# Patient Record
Sex: Female | Born: 1955 | Race: White | Hispanic: No | Smoking: Never smoker
Health system: Southern US, Community
[De-identification: ages and names within clinical notes are randomized; demographics above are authoritative.]

## PROBLEM LIST (undated history)

## (undated) DIAGNOSIS — F319 Bipolar disorder, unspecified: Secondary | ICD-10-CM

## (undated) DIAGNOSIS — J45909 Unspecified asthma, uncomplicated: Secondary | ICD-10-CM

## (undated) DIAGNOSIS — F32A Depression, unspecified: Secondary | ICD-10-CM

## (undated) DIAGNOSIS — G4730 Sleep apnea, unspecified: Secondary | ICD-10-CM

## (undated) DIAGNOSIS — I341 Nonrheumatic mitral (valve) prolapse: Secondary | ICD-10-CM

## (undated) DIAGNOSIS — D649 Anemia, unspecified: Secondary | ICD-10-CM

## (undated) DIAGNOSIS — I10 Essential (primary) hypertension: Secondary | ICD-10-CM

## (undated) DIAGNOSIS — Q631 Lobulated, fused and horseshoe kidney: Secondary | ICD-10-CM

## (undated) DIAGNOSIS — I499 Cardiac arrhythmia, unspecified: Secondary | ICD-10-CM

## (undated) DIAGNOSIS — R519 Headache, unspecified: Secondary | ICD-10-CM

## (undated) DIAGNOSIS — K219 Gastro-esophageal reflux disease without esophagitis: Secondary | ICD-10-CM

## (undated) DIAGNOSIS — M1990 Unspecified osteoarthritis, unspecified site: Secondary | ICD-10-CM

## (undated) DIAGNOSIS — F329 Major depressive disorder, single episode, unspecified: Secondary | ICD-10-CM

## (undated) DIAGNOSIS — F419 Anxiety disorder, unspecified: Secondary | ICD-10-CM

## (undated) HISTORY — PX: ABDOMINAL HYSTERECTOMY: SHX81

## (undated) HISTORY — PX: CARDIAC CATHETERIZATION: SHX172

## (undated) HISTORY — PX: SHOULDER ARTHROSCOPY: SHX128

## (undated) HISTORY — PX: TUBAL LIGATION: SHX77

## (undated) HISTORY — PX: BLADDER SURGERY: SHX569

---
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
## 2019-02-04 ENCOUNTER — Other Ambulatory Visit: Payer: Self-pay | Admitting: Neurological Surgery

## 2019-02-26 ENCOUNTER — Ambulatory Visit (HOSPITAL_COMMUNITY)
Admission: RE | Admit: 2019-02-26 | Discharge: 2019-02-26 | Disposition: A | Discharge status: Home / Self Care | Payer: Medicare Other | Source: Ambulatory Visit | Attending: Neurological Surgery | Admitting: Neurological Surgery

## 2019-02-26 ENCOUNTER — Encounter (HOSPITAL_COMMUNITY): Payer: Self-pay

## 2019-02-26 ENCOUNTER — Encounter (HOSPITAL_COMMUNITY)
Admission: RE | Admit: 2019-02-26 | Discharge: 2019-02-26 | Disposition: A | Discharge status: Home / Self Care | Payer: Medicare Other | Source: Ambulatory Visit | Attending: Neurological Surgery | Admitting: Neurological Surgery

## 2019-02-26 ENCOUNTER — Other Ambulatory Visit: Payer: Self-pay

## 2019-02-26 DIAGNOSIS — F319 Bipolar disorder, unspecified: Secondary | ICD-10-CM | POA: Diagnosis not present

## 2019-02-26 DIAGNOSIS — Z01818 Encounter for other preprocedural examination: Secondary | ICD-10-CM | POA: Insufficient documentation

## 2019-02-26 DIAGNOSIS — M542 Cervicalgia: Secondary | ICD-10-CM | POA: Diagnosis not present

## 2019-02-26 DIAGNOSIS — D649 Anemia, unspecified: Secondary | ICD-10-CM | POA: Insufficient documentation

## 2019-02-26 DIAGNOSIS — I10 Essential (primary) hypertension: Secondary | ICD-10-CM | POA: Diagnosis not present

## 2019-02-26 DIAGNOSIS — J45909 Unspecified asthma, uncomplicated: Secondary | ICD-10-CM | POA: Diagnosis not present

## 2019-02-26 DIAGNOSIS — G4733 Obstructive sleep apnea (adult) (pediatric): Secondary | ICD-10-CM | POA: Diagnosis not present

## 2019-02-26 DIAGNOSIS — I341 Nonrheumatic mitral (valve) prolapse: Secondary | ICD-10-CM | POA: Diagnosis not present

## 2019-02-26 DIAGNOSIS — Z79899 Other long term (current) drug therapy: Secondary | ICD-10-CM | POA: Insufficient documentation

## 2019-02-26 DIAGNOSIS — K219 Gastro-esophageal reflux disease without esophagitis: Secondary | ICD-10-CM | POA: Diagnosis not present

## 2019-02-26 HISTORY — DX: Cardiac arrhythmia, unspecified: I49.9

## 2019-02-26 HISTORY — DX: Headache, unspecified: R51.9

## 2019-02-26 HISTORY — DX: Unspecified asthma, uncomplicated: J45.909

## 2019-02-26 HISTORY — DX: Gastro-esophageal reflux disease without esophagitis: K21.9

## 2019-02-26 HISTORY — DX: Depression, unspecified: F32.A

## 2019-02-26 HISTORY — DX: Anxiety disorder, unspecified: F41.9

## 2019-02-26 HISTORY — DX: Anemia, unspecified: D64.9

## 2019-02-26 HISTORY — DX: Bipolar disorder, unspecified: F31.9

## 2019-02-26 HISTORY — DX: Lobulated, fused and horseshoe kidney: Q63.1

## 2019-02-26 HISTORY — DX: Essential (primary) hypertension: I10

## 2019-02-26 HISTORY — DX: Sleep apnea, unspecified: G47.30

## 2019-02-26 HISTORY — DX: Unspecified osteoarthritis, unspecified site: M19.90

## 2019-02-26 HISTORY — DX: Nonrheumatic mitral (valve) prolapse: I34.1

## 2019-02-26 LAB — BASIC METABOLIC PANEL
Anion gap: 11 (ref 5–15)
BUN: 18 mg/dL (ref 8–23)
CO2: 22 mmol/L (ref 22–32)
Calcium: 9.5 mg/dL (ref 8.9–10.3)
Chloride: 105 mmol/L (ref 98–111)
Creatinine, Ser: 1.06 mg/dL — ABNORMAL HIGH (ref 0.44–1.00)
GFR calc Af Amer: >60 mL/min (ref >60)
GFR calc non Af Amer: 55 mL/min — ABNORMAL LOW (ref >60)
Glucose, Bld: 176 mg/dL — ABNORMAL HIGH (ref 70–99)
Potassium: 3.9 mmol/L (ref 3.5–5.1)
Sodium: 138 mmol/L (ref 135–145)

## 2019-02-26 LAB — CBC WITH DIFFERENTIAL/PLATELET
Abs Immature Granulocytes: 0.07 10*3/uL (ref 0.00–0.07)
Basophils Absolute: 0.1 10*3/uL (ref 0.0–0.1)
Basophils Relative: 1 %
Eosinophils Absolute: 0.2 10*3/uL (ref 0.0–0.5)
Eosinophils Relative: 2 %
HCT: 41.4 % (ref 36.0–46.0)
Hemoglobin: 13.1 g/dL (ref 12.0–15.0)
Immature Granulocytes: 1 %
Lymphocytes Relative: 20 %
Lymphs Abs: 2.4 10*3/uL (ref 0.7–4.0)
MCH: 28.4 pg (ref 26.0–34.0)
MCHC: 31.6 g/dL (ref 30.0–36.0)
MCV: 89.8 fL (ref 80.0–100.0)
Monocytes Absolute: 1 10*3/uL (ref 0.1–1.0)
Monocytes Relative: 8 %
Neutro Abs: 8 10*3/uL — ABNORMAL HIGH (ref 1.7–7.7)
Neutrophils Relative %: 68 %
Platelets: 234 10*3/uL (ref 150–400)
RBC: 4.61 MIL/uL (ref 3.87–5.11)
RDW: 13.5 % (ref 11.5–15.5)
WBC: 11.7 10*3/uL — ABNORMAL HIGH (ref 4.0–10.5)
nRBC: 0 % (ref 0.0–0.2)

## 2019-02-26 LAB — PROTIME-INR
INR: 1 (ref 0.8–1.2)
Prothrombin Time: 12.8 seconds (ref 11.4–15.2)

## 2019-02-26 LAB — TYPE AND SCREEN
ABO/RH(D): A POS
Antibody Screen: NEGATIVE

## 2019-02-26 LAB — ABO/RH: ABO/RH(D): A POS

## 2019-02-26 LAB — SURGICAL PCR SCREEN
MRSA, PCR: NEGATIVE
Staphylococcus aureus: NEGATIVE

## 2019-02-26 NOTE — Progress Notes (Signed)
PCP - Dicky Doe, NP Cardiologist - patient denies  Chest x-ray - 02/26/2019 EKG - 02/26/2019 Stress Test - patient states "it was years ago" - records requested  ECHO - 2017 (Care everywhere) - records requested Cardiac Cath - patient states "it was years ago" - records requested  Sleep Study - patient is not sure when or where so I could not request records CPAP - yes  Fasting Blood Sugar - n/a Checks Blood Sugar _____ times a day  Blood Thinner Instructions:n/a Aspirin Instructions: n/a  Anesthesia review: yes, patient states she has MVP  Patient denies shortness of breath, fever, cough and chest pain at PAT appointment   Coronavirus Screening  Have you experienced the following symptoms:  Cough yes/no: No Fever (>100.43F)  yes/no: No Runny nose yes/no: No Sore throat yes/no: No Difficulty breathing/shortness of breath  yes/no: No  Have you or a family member traveled in the last 14 days and where? yes/no: No   If the patient indicates "YES" to the above questions, their PAT will be rescheduled to limit the exposure to others and, the surgeon will be notified. THE PATIENT WILL NEED TO BE ASYMPTOMATIC FOR 14 DAYS.   If the patient is not experiencing any of these symptoms, the PAT nurse will instruct them to NOT bring anyone with them to their appointment since they may have these symptoms or traveled as well.   Please remind your patients and families that hospital visitation restrictions are in effect and the importance of the restrictions.    Patient verbalized understanding of instructions that were given to them at the PAT appointment. Patient was also instructed that they will need to review over the PAT instructions again at home before surgery.

## 2019-02-26 NOTE — Progress Notes (Signed)
[facility] [address] - [place], [place] - [address] [place] [address] Phone: [phone] Fax: [phone]      Your procedure is scheduled on Monday 29, 2020.  Report to [facility] Main Entrance "A" at 0530 A.M., and check in at the Admitting office.  Call this number if you have problems the morning of surgery:  [phone]  Call [phone] if you have any questions prior to your surgery date Monday-Friday 8am-4pm    Remember:  Do not eat or drink after midnight the night before your surgery.    Take these medicines the morning of surgery with A SIP OF WATER: Acetaminophen (Tylenol) Citalopram (Celexa) Esomeprazole (Nexium) Fluticasone (Flonase) Hydroxyzine (Atarax) Lamotrigine (Lamictal)   7 days prior to surgery STOP taking any Aspirin (unless otherwise instructed by your surgeon), Aleve, Naproxen, Ibuprofen, Motrin, Advil, Goody's, BC's, all herbal medications, fish oil, and all vitamins.    The Morning of Surgery  Do not wear jewelry, make-up or nail polish.  Do not wear lotions, powders, or perfumes/colognes, or deodorant  Do not shave 48 hours prior to surgery.  Men may shave face and neck.  Do not bring valuables to the hospital.  [facility] is not responsible for any belongings or valuables.  If you are a smoker, DO NOT Smoke 24 hours prior to surgery IF you wear a CPAP at night please bring your mask, tubing, and machine the morning of surgery   Remember that you must have someone to transport you home after your surgery, and remain with you for 24 hours if you are discharged the same day.   Contacts, glasses, hearing aids, dentures or bridgework may not be worn into surgery.    Leave your suitcase in the car.  After surgery it may be brought to your room.  For patients admitted to the hospital, discharge time will be determined by your treatment team.  Patients discharged the day of surgery will not be allowed to drive home.     Special instructions:   [place]- Preparing For Surgery  Before surgery, you can play an important role. Because skin is not sterile, your skin needs to be as free of germs as possible. You can reduce the number of germs on your skin by washing with CHG (chlorahexidine gluconate) Soap before surgery.  CHG is an antiseptic cleaner which kills germs and bonds with the skin to continue killing germs even after washing.    Oral Hygiene is also important to reduce your risk of infection.  Remember - BRUSH YOUR TEETH THE MORNING OF SURGERY WITH YOUR REGULAR TOOTHPASTE  Please do not use if you have an allergy to CHG or antibacterial soaps. If your skin becomes reddened/irritated stop using the CHG.  Do not shave (including legs and underarms) for at least 48 hours prior to first CHG shower. It is OK to shave your face.  Please follow these instructions carefully.   1. Shower the NIGHT BEFORE SURGERY and the MORNING OF SURGERY with CHG Soap.   2. If you chose to wash your hair, wash your hair first as usual with your normal shampoo.  3. After you shampoo, rinse your hair and body thoroughly to remove the shampoo.  4. Use CHG as you would any other liquid soap. You can apply CHG directly to the skin and wash gently with a scrungie or a clean washcloth.   5. Apply the CHG Soap to your body ONLY FROM THE NECK  DOWN.  Do not use on open wounds or open sores. Avoid contact with your eyes, ears, mouth and genitals (private parts). Wash Face and genitals (private parts)  with your normal soap.   6. Wash thoroughly, paying special attention to the area where your surgery will be performed.  7. Thoroughly rinse your body with warm water from the neck down.  8. DO NOT shower/wash with your normal soap after using and rinsing off the CHG Soap.  9. Pat yourself dry with a CLEAN TOWEL.  10. Wear CLEAN PAJAMAS to bed the night before surgery, wear comfortable clothes the morning of  surgery  11. Place CLEAN SHEETS on your bed the night of your first shower and DO NOT SLEEP WITH PETS.    Day of Surgery: Shower as stated above. Do not apply any deodorants/lotions.  Please wear clean clothes to the hospital/surgery center.   Remember to brush your teeth WITH YOUR REGULAR TOOTHPASTE.   Please read over the following fact sheets that you were given.

## 2019-03-03 ENCOUNTER — Encounter (HOSPITAL_COMMUNITY): Payer: Self-pay

## 2019-03-03 NOTE — Progress Notes (Addendum)
Anesthesia Chart Review:  Case: [id number] Date/Time: 03/08/19 0715   Procedure: ACDF - C5-C6 - C6-C7 (N/A )   Anesthesia type: General   Pre-op diagnosis: Cervicalgia   Location: [facility] OR ROOM 18 / [place] OR   Surgeon: Eustace Moore, MD      DISCUSSION: Patient is a 63 year old female scheduled for the above procedure.  History includes never smoker, HTN, OSA (CPAP), MVP (grossly normal MV, mild MR/TR 2017), dysrhythmia (not specified), asthma, GERD, anemia, Bipolar disorder. BMI is consistent with morbid obesity.   Presurgical COVID test is scheduled for 03/04/19.  If result is negative and otherwise no acute changes and I would anticipate that she can proceed as planned.   VS: BP 128/68   Pulse 88   Temp 36.7 C   Resp 20   Ht 5\' 2"  (1.575 m)   Wt 108 kg   SpO2 94%   BMI 43.53 kg/m    PROVIDERS: Noralyn Pick, NP is PCP ([place], see Care Everywhere) [phone]  [phone] (Fax) - Laurena Slimmer, MD is nephrologist ([place]). 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 Wendi Snipes, MD is psychiatrist - She was evaluated by cardiologist Dennison Nancy, DO on 08/29/16 for evaluation of edema. Edema felt likely from venous insufficiency, but echo ordered to rule out structural heart disease. Unless significant abnormalities then as needed follow-up was anticipated. ([place], see Care Everywhere). [phone]  [phone] (Fax)    LABS: Preoperative labs noted. Non-fasting glucose 176 (no reported DM history; last A1c seen was 6.4 on 04/22/18, [facility]).  (all labs ordered are listed, but only abnormal results are displayed)  Labs Reviewed  BASIC METABOLIC PANEL - Abnormal; Notable for the following components:      Result Value   Glucose, Bld 176 (*)    Creatinine, Ser 1.06 (*)    GFR calc non Af Amer 55 (*)    All other components within normal limits  CBC WITH  DIFFERENTIAL/PLATELET - Abnormal; Notable for the following components:   WBC 11.7 (*)    Neutro Abs 8.0 (*)    All other components within normal limits  SURGICAL PCR SCREEN  PROTIME-INR  TYPE AND SCREEN  ABO/RH    IMAGES: CXR 02/26/19: FINDINGS: The heart size and mediastinal contours are within normal limits. Both lungs are clear. Slight accentuation of the thoracic kyphosis. IMPRESSION: No significant abnormalities.   EKG: 02/26/19: Normal sinus rhythm RSR' or QR pattern in V1 suggests right ventricular conduction delay Minimal voltage criteria for LVH, may be normal variant Nonspecific ST abnormality Abnormal ECG No previous tracing Confirmed by Quay Burow [phone]) on 03/01/2019 1:46:40 PM  - EKG not significantly changed when compared to 03/10/18 EKG tracing from [place] (which was done at PCP office for high risk psychiatric medication).    CV: Echo 09/05/16 [facility]iology): Summary: The study was technically difficult. The left ventricle is normal in size, wall thickness and wall motion with ejection fraction of 60-65%. The mitral valve is grossly normal.  There is mild (1+) mitral regurgitation. There is mild (1+) tricuspid regurgitation. Aortic valve is not well visualized, but is grossly normal. Right ventricular systolic function is normal. The left atrium is mildly dilated. Grade 2 moderate diastolic dysfunction; pseudo-normal mitral inflow pattern.  Patient thought she may have had a stress and/or cardiac cath "years ago" (possibly 10 years) but no records seen in [facility] or Care Everywhere and  not mentioned in 2017 cardiology notes.    Past Medical History:  Diagnosis Date  . Anemia   . Anxiety   . Arthritis   . Asthma   . Bipolar disorder (HCC)   . Depression   . Dysrhythmia   . GERD (gastroesophageal reflux disease)   . Headache   . Horseshoe kidney    Nephrologist: Dr. Angelique Blonderenise Laurienti [facility]  . Hypertension   . Mitral valve prolapse   .  Sleep apnea    wears cpap    Past Surgical History:  Procedure Laterality Date  . ABDOMINAL HYSTERECTOMY    . BLADDER SURGERY    . BLADDER SURGERY     mesh removal  . CARDIAC CATHETERIZATION     patient unsure when or where  . SHOULDER ARTHROSCOPY Right    x3  . TUBAL LIGATION      MEDICATIONS: . acetaminophen (TYLENOL) 500 MG tablet  . citalopram (CELEXA) 40 MG tablet  . esomeprazole (NEXIUM) 20 MG capsule  . fluticasone (FLONASE) 50 MCG/ACT nasal spray  . hydrOXYzine (ATARAX/VISTARIL) 25 MG tablet  . lamoTRIgine (LAMICTAL) 25 MG tablet  . losartan (COZAAR) 25 MG tablet  . traZODone (DESYREL) 100 MG tablet   No current facility-administered medications for this encounter.     Shonna ChockAllison Casey Maxfield, PA-C Surgical Short Stay/Anesthesiology [facility] Phone [phone] [facility] Phone [phone] 03/04/2019 11:12 AM

## 2019-03-04 ENCOUNTER — Encounter (HOSPITAL_COMMUNITY): Payer: Self-pay

## 2019-03-04 ENCOUNTER — Other Ambulatory Visit (HOSPITAL_COMMUNITY)
Admission: RE | Admit: 2019-03-04 | Discharge: 2019-03-04 | Disposition: A | Discharge status: Home / Self Care | Payer: Medicare Other | Source: Ambulatory Visit | Attending: Neurological Surgery | Admitting: Neurological Surgery

## 2019-03-04 DIAGNOSIS — Z1159 Encounter for screening for other viral diseases: Secondary | ICD-10-CM | POA: Diagnosis present

## 2019-03-04 LAB — SARS CORONAVIRUS 2 (TAT 6-24 HRS): SARS Coronavirus 2: NEGATIVE

## 2019-03-04 NOTE — Anesthesia Preprocedure Evaluation (Addendum)
Anesthesia Evaluation  Patient identified by MRN, date of birth, ID band Patient awake    Reviewed: NPO status , Patient's Chart, lab work & pertinent test results  Airway Mallampati: II  TM Distance: >3 FB     Dental   Pulmonary    breath sounds clear to auscultation       Cardiovascular hypertension, + dysrhythmias  Rhythm:Regular     Neuro/Psych    GI/Hepatic GERD  ,  Endo/Other    Renal/GU Renal disease     Musculoskeletal   Abdominal   Peds  Hematology  (+) anemia ,   Anesthesia Other Findings   Reproductive/Obstetrics                           Anesthesia Physical Anesthesia Plan  ASA: III  Anesthesia Plan: General   Post-op Pain Management:    Induction: Intravenous  PONV Risk Score and Plan: 3 and Ondansetron, Dexamethasone and Midazolam  Airway Management Planned: Oral ETT  Additional Equipment:   Intra-op Plan:   Post-operative Plan: Possible Post-op intubation/ventilation  Informed Consent: I have reviewed the patients History and Physical, chart, labs and discussed the procedure including the risks, benefits and alternatives for the proposed anesthesia with the patient or authorized representative who has indicated his/her understanding and acceptance.     Dental advisory given  Plan Discussed with: Anesthesiologist and CRNA  Anesthesia Plan Comments: (PAT note written 03/04/2019 by Myra Gianotti, PA-C. )      Anesthesia Quick Evaluation

## 2019-03-08 ENCOUNTER — Inpatient Hospital Stay (HOSPITAL_COMMUNITY): Payer: Medicare Other

## 2019-03-08 ENCOUNTER — Inpatient Hospital Stay (HOSPITAL_COMMUNITY): Payer: Medicare Other | Admitting: Anesthesiology

## 2019-03-08 ENCOUNTER — Encounter (HOSPITAL_COMMUNITY): Payer: Self-pay

## 2019-03-08 ENCOUNTER — Inpatient Hospital Stay (HOSPITAL_COMMUNITY): Payer: Medicare Other | Admitting: Physician Assistant

## 2019-03-08 ENCOUNTER — Encounter (HOSPITAL_COMMUNITY)
Admission: RE | Disposition: A | Discharge status: Home / Self Care | Payer: Self-pay | Source: Home / Self Care | Attending: Neurological Surgery

## 2019-03-08 ENCOUNTER — Other Ambulatory Visit: Payer: Self-pay

## 2019-03-08 ENCOUNTER — Observation Stay (HOSPITAL_COMMUNITY)
Admission: RE | Admit: 2019-03-08 | Discharge: 2019-03-09 | Disposition: A | Discharge status: Home / Self Care | Payer: Medicare Other | Attending: Neurological Surgery | Admitting: Neurological Surgery

## 2019-03-08 DIAGNOSIS — K219 Gastro-esophageal reflux disease without esophagitis: Secondary | ICD-10-CM | POA: Insufficient documentation

## 2019-03-08 DIAGNOSIS — Z419 Encounter for procedure for purposes other than remedying health state, unspecified: Secondary | ICD-10-CM

## 2019-03-08 DIAGNOSIS — Z881 Allergy status to other antibiotic agents status: Secondary | ICD-10-CM | POA: Diagnosis not present

## 2019-03-08 DIAGNOSIS — Z882 Allergy status to sulfonamides status: Secondary | ICD-10-CM | POA: Diagnosis not present

## 2019-03-08 DIAGNOSIS — M50322 Other cervical disc degeneration at C5-C6 level: Secondary | ICD-10-CM | POA: Diagnosis not present

## 2019-03-08 DIAGNOSIS — M47812 Spondylosis without myelopathy or radiculopathy, cervical region: Secondary | ICD-10-CM | POA: Diagnosis not present

## 2019-03-08 DIAGNOSIS — M4802 Spinal stenosis, cervical region: Secondary | ICD-10-CM | POA: Insufficient documentation

## 2019-03-08 DIAGNOSIS — Z885 Allergy status to narcotic agent status: Secondary | ICD-10-CM | POA: Diagnosis not present

## 2019-03-08 DIAGNOSIS — F419 Anxiety disorder, unspecified: Secondary | ICD-10-CM | POA: Diagnosis not present

## 2019-03-08 DIAGNOSIS — J45909 Unspecified asthma, uncomplicated: Secondary | ICD-10-CM | POA: Insufficient documentation

## 2019-03-08 DIAGNOSIS — F319 Bipolar disorder, unspecified: Secondary | ICD-10-CM | POA: Insufficient documentation

## 2019-03-08 DIAGNOSIS — Z79899 Other long term (current) drug therapy: Secondary | ICD-10-CM | POA: Diagnosis not present

## 2019-03-08 DIAGNOSIS — I10 Essential (primary) hypertension: Secondary | ICD-10-CM | POA: Diagnosis not present

## 2019-03-08 DIAGNOSIS — Z981 Arthrodesis status: Secondary | ICD-10-CM

## 2019-03-08 DIAGNOSIS — G4730 Sleep apnea, unspecified: Secondary | ICD-10-CM | POA: Diagnosis not present

## 2019-03-08 HISTORY — PX: ANTERIOR CERVICAL DECOMP/DISCECTOMY FUSION: SHX1161

## 2019-03-08 SURGERY — ANTERIOR CERVICAL DECOMPRESSION/DISCECTOMY FUSION 2 LEVELS
Anesthesia: General | Site: Spine Cervical

## 2019-03-08 MED ORDER — CEFAZOLIN SODIUM-DEXTROSE 2-4 GM/100ML-% IV SOLN
2.0000 g | INTRAVENOUS | Status: AC
  Administered 2019-03-08: 2 g via INTRAVENOUS
  Filled 2019-03-08: qty 100

## 2019-03-08 MED ORDER — SUGAMMADEX SODIUM 200 MG/2ML IV SOLN
INTRAVENOUS | Status: DC | PRN
  Administered 2019-03-08: 200 mg via INTRAVENOUS

## 2019-03-08 MED ORDER — CITALOPRAM HYDROBROMIDE 20 MG PO TABS
40.0000 mg | ORAL_TABLET | Freq: Every day | ORAL | Status: DC
  Administered 2019-03-09: 40 mg via ORAL
  Filled 2019-03-08: qty 2

## 2019-03-08 MED ORDER — 0.9 % SODIUM CHLORIDE (POUR BTL) OPTIME
TOPICAL | Status: DC | PRN
  Administered 2019-03-08: 1000 mL

## 2019-03-08 MED ORDER — SODIUM CHLORIDE 0.9% FLUSH: 3.0000 mL | INTRAVENOUS | Status: DC | PRN

## 2019-03-08 MED ORDER — PHENYLEPHRINE 40 MCG/ML (10ML) SYRINGE FOR IV PUSH (FOR BLOOD PRESSURE SUPPORT)
PREFILLED_SYRINGE | INTRAVENOUS | Status: AC
  Filled 2019-03-08: qty 10

## 2019-03-08 MED ORDER — CEFAZOLIN SODIUM-DEXTROSE 2-4 GM/100ML-% IV SOLN
2.0000 g | Freq: Three times a day (TID) | INTRAVENOUS | Status: AC
  Administered 2019-03-08 (×2): 2 g via INTRAVENOUS
  Filled 2019-03-08 (×2): qty 100

## 2019-03-08 MED ORDER — PROPOFOL 10 MG/ML IV BOLUS
INTRAVENOUS | Status: DC | PRN
  Administered 2019-03-08: 160 mg via INTRAVENOUS

## 2019-03-08 MED ORDER — FENTANYL CITRATE (PF) 250 MCG/5ML IJ SOLN
INTRAMUSCULAR | Status: AC
  Filled 2019-03-08: qty 5

## 2019-03-08 MED ORDER — DEXAMETHASONE SODIUM PHOSPHATE 10 MG/ML IJ SOLN
10.0000 mg | INTRAMUSCULAR | Status: DC
  Filled 2019-03-08: qty 1

## 2019-03-08 MED ORDER — DEXAMETHASONE 4 MG PO TABS
4.0000 mg | ORAL_TABLET | Freq: Four times a day (QID) | ORAL | Status: DC
  Administered 2019-03-08 – 2019-03-09 (×3): 4 mg via ORAL
  Filled 2019-03-08 (×3): qty 1

## 2019-03-08 MED ORDER — ROCURONIUM BROMIDE 10 MG/ML (PF) SYRINGE
PREFILLED_SYRINGE | INTRAVENOUS | Status: DC | PRN
  Administered 2019-03-08: 10 mg via INTRAVENOUS
  Administered 2019-03-08: 60 mg via INTRAVENOUS

## 2019-03-08 MED ORDER — MORPHINE SULFATE (PF) 2 MG/ML IV SOLN
2.0000 mg | INTRAVENOUS | Status: DC | PRN
  Administered 2019-03-08: 2 mg via INTRAVENOUS
  Filled 2019-03-08: qty 1

## 2019-03-08 MED ORDER — SODIUM CHLORIDE 0.9 % IV SOLN: 250.0000 mL | INTRAVENOUS | Status: DC

## 2019-03-08 MED ORDER — PHENOL 1.4 % MT LIQD: 1.0000 | OROMUCOSAL | Status: DC | PRN

## 2019-03-08 MED ORDER — LIDOCAINE 2% (20 MG/ML) 5 ML SYRINGE
INTRAMUSCULAR | Status: AC
  Filled 2019-03-08: qty 5

## 2019-03-08 MED ORDER — LOSARTAN POTASSIUM 25 MG PO TABS
25.0000 mg | ORAL_TABLET | Freq: Every day | ORAL | Status: DC
  Administered 2019-03-08 – 2019-03-09 (×2): 25 mg via ORAL
  Filled 2019-03-08 (×2): qty 1

## 2019-03-08 MED ORDER — BUPIVACAINE HCL (PF) 0.25 % IJ SOLN
INTRAMUSCULAR | Status: DC | PRN
  Administered 2019-03-08: 10 mL

## 2019-03-08 MED ORDER — DEXAMETHASONE SODIUM PHOSPHATE 10 MG/ML IJ SOLN
INTRAMUSCULAR | Status: DC | PRN
  Administered 2019-03-08: 10 mg via INTRAVENOUS

## 2019-03-08 MED ORDER — CHLORHEXIDINE GLUCONATE CLOTH 2 % EX PADS: 6.0000 | MEDICATED_PAD | Freq: Once | CUTANEOUS | Status: DC

## 2019-03-08 MED ORDER — SODIUM CHLORIDE 0.9 % IV SOLN
INTRAVENOUS | Status: DC | PRN
  Administered 2019-03-08: 25 ug/min via INTRAVENOUS

## 2019-03-08 MED ORDER — ACETAMINOPHEN 325 MG PO TABS: 650.0000 mg | ORAL_TABLET | ORAL | Status: DC | PRN

## 2019-03-08 MED ORDER — DEXAMETHASONE SODIUM PHOSPHATE 10 MG/ML IJ SOLN
INTRAMUSCULAR | Status: AC
  Filled 2019-03-08: qty 1

## 2019-03-08 MED ORDER — ACETAMINOPHEN 650 MG RE SUPP: 650.0000 mg | RECTAL | Status: DC | PRN

## 2019-03-08 MED ORDER — ONDANSETRON HCL 4 MG/2ML IJ SOLN
INTRAMUSCULAR | Status: AC
  Filled 2019-03-08: qty 2

## 2019-03-08 MED ORDER — PHENYLEPHRINE 40 MCG/ML (10ML) SYRINGE FOR IV PUSH (FOR BLOOD PRESSURE SUPPORT)
PREFILLED_SYRINGE | INTRAVENOUS | Status: DC | PRN
  Administered 2019-03-08: 80 ug via INTRAVENOUS

## 2019-03-08 MED ORDER — HYDROCODONE-ACETAMINOPHEN 5-325 MG PO TABS
ORAL_TABLET | ORAL | Status: AC
  Filled 2019-03-08: qty 1

## 2019-03-08 MED ORDER — THROMBIN 5000 UNITS EX SOLR
CUTANEOUS | Status: AC
  Filled 2019-03-08: qty 5000

## 2019-03-08 MED ORDER — ROCURONIUM BROMIDE 10 MG/ML (PF) SYRINGE
PREFILLED_SYRINGE | INTRAVENOUS | Status: AC
  Filled 2019-03-08: qty 10

## 2019-03-08 MED ORDER — LACTATED RINGERS IV SOLN
INTRAVENOUS | Status: DC | PRN
  Administered 2019-03-08 (×2): via INTRAVENOUS

## 2019-03-08 MED ORDER — FENTANYL CITRATE (PF) 100 MCG/2ML IJ SOLN
INTRAMUSCULAR | Status: AC
  Administered 2019-03-08: 50 ug via INTRAVENOUS
  Filled 2019-03-08: qty 2

## 2019-03-08 MED ORDER — POTASSIUM CHLORIDE IN NACL 20-0.9 MEQ/L-% IV SOLN: INTRAVENOUS | Status: DC

## 2019-03-08 MED ORDER — ONDANSETRON HCL 4 MG/2ML IJ SOLN
INTRAMUSCULAR | Status: DC | PRN
  Administered 2019-03-08: 4 mg via INTRAVENOUS

## 2019-03-08 MED ORDER — SODIUM CHLORIDE 0.9% FLUSH
3.0000 mL | Freq: Two times a day (BID) | INTRAVENOUS | Status: DC
  Administered 2019-03-08: 3 mL via INTRAVENOUS

## 2019-03-08 MED ORDER — METHOCARBAMOL 500 MG PO TABS
500.0000 mg | ORAL_TABLET | Freq: Four times a day (QID) | ORAL | Status: DC | PRN
  Administered 2019-03-08 – 2019-03-09 (×3): 500 mg via ORAL
  Filled 2019-03-08 (×2): qty 1

## 2019-03-08 MED ORDER — PANTOPRAZOLE SODIUM 40 MG PO TBEC
40.0000 mg | DELAYED_RELEASE_TABLET | Freq: Every day | ORAL | Status: DC
  Administered 2019-03-09: 40 mg via ORAL
  Filled 2019-03-08: qty 2

## 2019-03-08 MED ORDER — PROPOFOL 10 MG/ML IV BOLUS
INTRAVENOUS | Status: AC
  Filled 2019-03-08: qty 40

## 2019-03-08 MED ORDER — LIDOCAINE 2% (20 MG/ML) 5 ML SYRINGE
INTRAMUSCULAR | Status: DC | PRN
  Administered 2019-03-08: 100 mg via INTRAVENOUS

## 2019-03-08 MED ORDER — ONDANSETRON HCL 4 MG PO TABS: 4.0000 mg | ORAL_TABLET | Freq: Four times a day (QID) | ORAL | Status: DC | PRN

## 2019-03-08 MED ORDER — METHOCARBAMOL 500 MG PO TABS
ORAL_TABLET | ORAL | Status: AC
  Filled 2019-03-08: qty 1

## 2019-03-08 MED ORDER — ARTIFICIAL TEARS OPHTHALMIC OINT
TOPICAL_OINTMENT | OPHTHALMIC | Status: AC
  Filled 2019-03-08: qty 3.5

## 2019-03-08 MED ORDER — THROMBIN 5000 UNITS EX SOLR
OROMUCOSAL | Status: DC | PRN
  Administered 2019-03-08: 5 mL

## 2019-03-08 MED ORDER — MIDAZOLAM HCL 2 MG/2ML IJ SOLN
INTRAMUSCULAR | Status: AC
  Filled 2019-03-08: qty 2

## 2019-03-08 MED ORDER — MENTHOL 3 MG MT LOZG: 1.0000 | LOZENGE | OROMUCOSAL | Status: DC | PRN

## 2019-03-08 MED ORDER — ONDANSETRON HCL 4 MG/2ML IJ SOLN: 4.0000 mg | Freq: Four times a day (QID) | INTRAMUSCULAR | Status: DC | PRN

## 2019-03-08 MED ORDER — LAMOTRIGINE 25 MG PO TABS
50.0000 mg | ORAL_TABLET | Freq: Every day | ORAL | Status: DC
  Administered 2019-03-09: 50 mg via ORAL
  Filled 2019-03-08: qty 2

## 2019-03-08 MED ORDER — SODIUM CHLORIDE 0.9 % IV SOLN
INTRAVENOUS | Status: DC | PRN
  Administered 2019-03-08: 500 mL

## 2019-03-08 MED ORDER — FENTANYL CITRATE (PF) 100 MCG/2ML IJ SOLN
25.0000 ug | INTRAMUSCULAR | Status: DC | PRN
  Administered 2019-03-08 (×2): 50 ug via INTRAVENOUS

## 2019-03-08 MED ORDER — MIDAZOLAM HCL 5 MG/5ML IJ SOLN
INTRAMUSCULAR | Status: DC | PRN
  Administered 2019-03-08: 2 mg via INTRAVENOUS

## 2019-03-08 MED ORDER — METHOCARBAMOL 1000 MG/10ML IJ SOLN
500.0000 mg | Freq: Four times a day (QID) | INTRAVENOUS | Status: DC | PRN
  Filled 2019-03-08: qty 5

## 2019-03-08 MED ORDER — SENNA 8.6 MG PO TABS
1.0000 | ORAL_TABLET | Freq: Two times a day (BID) | ORAL | Status: DC
  Administered 2019-03-08 – 2019-03-09 (×3): 8.6 mg via ORAL
  Filled 2019-03-08 (×3): qty 1

## 2019-03-08 MED ORDER — HYDROCODONE-ACETAMINOPHEN 5-325 MG PO TABS
1.0000 | ORAL_TABLET | ORAL | Status: DC | PRN
  Administered 2019-03-08 – 2019-03-09 (×6): 1 via ORAL
  Filled 2019-03-08 (×5): qty 1

## 2019-03-08 MED ORDER — FENTANYL CITRATE (PF) 100 MCG/2ML IJ SOLN
INTRAMUSCULAR | Status: DC | PRN
  Administered 2019-03-08: 100 ug via INTRAVENOUS
  Administered 2019-03-08 (×3): 50 ug via INTRAVENOUS

## 2019-03-08 MED ORDER — BUPIVACAINE HCL (PF) 0.25 % IJ SOLN
INTRAMUSCULAR | Status: AC
  Filled 2019-03-08: qty 30

## 2019-03-08 MED ORDER — DEXAMETHASONE SODIUM PHOSPHATE 4 MG/ML IJ SOLN
4.0000 mg | Freq: Four times a day (QID) | INTRAMUSCULAR | Status: DC
  Administered 2019-03-08: 4 mg via INTRAVENOUS
  Filled 2019-03-08: qty 1

## 2019-03-08 SURGICAL SUPPLY — 58 items
BAG DECANTER FOR FLEXI CONT (MISCELLANEOUS) ×2 IMPLANT
BASKET BONE COLLECTION (BASKET) ×2 IMPLANT
BENZOIN TINCTURE PRP APPL 2/3 (GAUZE/BANDAGES/DRESSINGS) ×2 IMPLANT
BIT DRILL 2.3 12 FIXED (INSTRUMENTS) ×1 IMPLANT
BUR MATCHSTICK NEURO 3.0 LAGG (BURR) ×2 IMPLANT
CANISTER SUCT 3000ML PPV (MISCELLANEOUS) ×2 IMPLANT
CARTRIDGE OIL MAESTRO DRILL (MISCELLANEOUS) ×1 IMPLANT
COVER WAND RF STERILE (DRAPES) IMPLANT
DERMABOND ADVANCED (GAUZE/BANDAGES/DRESSINGS) ×1
DERMABOND ADVANCED .7 DNX12 (GAUZE/BANDAGES/DRESSINGS) ×1 IMPLANT
DIFFUSER DRILL AIR PNEUMATIC (MISCELLANEOUS) ×2 IMPLANT
DRAPE C-ARM 42X72 X-RAY (DRAPES) ×4 IMPLANT
DRAPE LAPAROTOMY 100X72 PEDS (DRAPES) ×2 IMPLANT
DRAPE MICROSCOPE LEICA (MISCELLANEOUS) ×2 IMPLANT
DRILL 12MM (INSTRUMENTS) ×2
DRSG OPSITE POSTOP 4X6 (GAUZE/BANDAGES/DRESSINGS) ×2 IMPLANT
DURAPREP 6ML APPLICATOR 50/CS (WOUND CARE) ×2 IMPLANT
ELECT COATED BLADE 2.86 ST (ELECTRODE) ×2 IMPLANT
ELECT REM PT RETURN 9FT ADLT (ELECTROSURGICAL) ×2
ELECTRODE REM PT RTRN 9FT ADLT (ELECTROSURGICAL) ×1 IMPLANT
GAUZE 4X4 16PLY RFD (DISPOSABLE) IMPLANT
GLOVE BIO SURGEON STRL SZ7 (GLOVE) ×2 IMPLANT
GLOVE BIO SURGEON STRL SZ8 (GLOVE) ×2 IMPLANT
GLOVE BIOGEL PI IND STRL 7.0 (GLOVE) ×4 IMPLANT
GLOVE BIOGEL PI IND STRL 7.5 (GLOVE) ×1 IMPLANT
GLOVE BIOGEL PI INDICATOR 7.0 (GLOVE) ×4
GLOVE BIOGEL PI INDICATOR 7.5 (GLOVE) ×1
GLOVE SS N UNI LF 7.0 STRL (GLOVE) ×8 IMPLANT
GLOVE SS N UNI LF 7.5 STRL (GLOVE) ×8 IMPLANT
GOWN STRL REUS W/ TWL LRG LVL3 (GOWN DISPOSABLE) ×4 IMPLANT
GOWN STRL REUS W/ TWL XL LVL3 (GOWN DISPOSABLE) ×1 IMPLANT
GOWN STRL REUS W/TWL 2XL LVL3 (GOWN DISPOSABLE) IMPLANT
GOWN STRL REUS W/TWL LRG LVL3 (GOWN DISPOSABLE) ×4
GOWN STRL REUS W/TWL XL LVL3 (GOWN DISPOSABLE) ×1
HEMOSTAT POWDER KIT SURGIFOAM (HEMOSTASIS) ×2 IMPLANT
KIT BASIN OR (CUSTOM PROCEDURE TRAY) ×2 IMPLANT
KIT TURNOVER KIT B (KITS) ×2 IMPLANT
NEEDLE HYPO 25X1 1.5 SAFETY (NEEDLE) ×2 IMPLANT
NEEDLE SPNL 20GX3.5 QUINCKE YW (NEEDLE) ×2 IMPLANT
NS IRRIG 1000ML POUR BTL (IV SOLUTION) ×2 IMPLANT
OIL CARTRIDGE MAESTRO DRILL (MISCELLANEOUS) ×2
PACK LAMINECTOMY NEURO (CUSTOM PROCEDURE TRAY) ×2 IMPLANT
PAD ARMBOARD 7.5X6 YLW CONV (MISCELLANEOUS) ×6 IMPLANT
PIN DISTRACTION 14MM (PIN) ×4 IMPLANT
PLATE 30MM (Plate) ×2 IMPLANT
RUBBERBAND STERILE (MISCELLANEOUS) ×4 IMPLANT
SCREW 12 (Screw) ×10 IMPLANT
SCREW VAR SELF TAP 4.0X12MM (Screw) ×2 IMPLANT
SPACER ASSEM CERV LORD 6M (Spacer) ×2 IMPLANT
SPACER ASSEM CERV LORD 7M (Spacer) ×2 IMPLANT
SPONGE INTESTINAL PEANUT (DISPOSABLE) ×2 IMPLANT
SPONGE SURGIFOAM ABS GEL SZ50 (HEMOSTASIS) ×2 IMPLANT
STRIP CLOSURE SKIN 1/2X4 (GAUZE/BANDAGES/DRESSINGS) ×2 IMPLANT
SUT VIC AB 3-0 SH 8-18 (SUTURE) ×4 IMPLANT
SUT VICRYL 4-0 PS2 18IN ABS (SUTURE) ×2 IMPLANT
TOWEL GREEN STERILE (TOWEL DISPOSABLE) ×2 IMPLANT
TOWEL GREEN STERILE FF (TOWEL DISPOSABLE) ×2 IMPLANT
WATER STERILE IRR 1000ML POUR (IV SOLUTION) ×2 IMPLANT

## 2019-03-08 NOTE — Transfer of Care (Signed)
Immediate Anesthesia Transfer of Care Note  Patient: Laura Stephens  Procedure(s) Performed: ACDF - C5-C6 - C6-C7 (N/A Spine Cervical)  Patient Location: PACU  Anesthesia Type:General  Level of Consciousness: patient cooperative and responds to stimulation  Airway & Oxygen Therapy: Patient Spontanous Breathing and Patient connected to nasal cannula oxygen  Post-op Assessment: Report given to RN and Post -op Vital signs reviewed and stable  Post vital signs: Reviewed and stable  Last Vitals:  Vitals Value Taken Time  BP 101/80 03/08/19 1025  Temp    Pulse 81 03/08/19 1030  Resp 21 03/08/19 1030  SpO2 91 % 03/08/19 1030  Vitals shown include unvalidated device data.  Last Pain:  Vitals:   03/08/19 0604  TempSrc:   PainSc: 5       Patients Stated Pain Goal: 3 (21/30/86 5784)  Complications: No apparent anesthesia complications

## 2019-03-08 NOTE — H&P (Signed)
Subjective:   Patient is a 63 y.o. female admitted for neck pain. The patient first presented to me with complaints of neck pain. Onset of symptoms was several months ago. The pain is described as aching and occurs all day. The pain is rated severe, and is located in the neck and radiates to the shoulders. The symptoms have been progressive. Symptoms are exacerbated by extending head backwards, and are relieved by none and rest.  Previous work up includes MRI of cervical spine, results: spinal stenosis.  Past Medical History:  Diagnosis Date  . Anemia   . Anxiety   . Arthritis   . Asthma   . Bipolar disorder (HCC)   . Depression   . Dysrhythmia   . GERD (gastroesophageal reflux disease)   . Headache   . Horseshoe kidney    Nephrologist: Dr. Angelique Blonderenise Laurienti [facility]  . Hypertension   . Mitral valve prolapse    09/05/16 echo ([facility]): LVEF 60-65%, MV grossly normal, mild MR/TR, grade II DD  . Sleep apnea    wears cpap    Past Surgical History:  Procedure Laterality Date  . ABDOMINAL HYSTERECTOMY    . BLADDER SURGERY    . BLADDER SURGERY     mesh removal  . CARDIAC CATHETERIZATION     patient unsure when or where  . SHOULDER ARTHROSCOPY Right    x3  . TUBAL LIGATION      Allergies  Allergen Reactions  . Morphine And Related Itching  . Sulfur Itching  . Vibramycin [Doxycycline Calcium] Itching    Social History   Tobacco Use  . Smoking status: Never Smoker  . Smokeless tobacco: Never Used  Substance Use Topics  . Alcohol use: Never    Frequency: Never    History reviewed. No pertinent family history. Prior to Admission medications   Medication Sig Start Date End Date Taking? Authorizing Provider  acetaminophen (TYLENOL) 500 MG tablet Take 1,000 mg by mouth every 6 (six) hours as needed for moderate pain.   Yes [provider]  citalopram (CELEXA) 40 MG tablet Take 40 mg by mouth daily.   Yes [provider]  esomeprazole (NEXIUM) 20 MG capsule  Take 20 mg by mouth daily at 12 noon.   Yes [provider]  fluticasone (FLONASE) 50 MCG/ACT nasal spray Place 1 spray into both nostrils daily.   Yes [provider]  hydrOXYzine (ATARAX/VISTARIL) 25 MG tablet Take 25 mg by mouth 3 (three) times daily as needed for anxiety.    Yes [provider]  lamoTRIgine (LAMICTAL) 25 MG tablet Take 50 mg by mouth daily.   Yes [provider]  losartan (COZAAR) 25 MG tablet Take 25 mg by mouth daily.   Yes [provider]  traZODone (DESYREL) 100 MG tablet Take 200 mg by mouth at bedtime.   Yes [provider]     Review of Systems  Positive ROS: neg  All other systems have been reviewed and were otherwise negative with the exception of those mentioned in the HPI and as above.  Objective: Vital signs in last 24 hours: Temp:  [97.9 F (36.6 C)] 97.9 F (36.6 C) (06/29 0550) Pulse Rate:  [64] 64 (06/29 0550) Resp:  [18] 18 (06/29 0550) BP: (170)/(81) 170/81 (06/29 0550) SpO2:  [96 %] 96 % (06/29 0550) Weight:  [161[108 kg] 108 kg (06/29 0604)  General Appearance: Alert, cooperative, no distress, appears stated age Head: Normocephalic, without obvious abnormality, atraumatic Eyes: PERRL, conjunctiva/corneas clear, EOM's  intact      Neck: Supple, symmetrical, trachea midline, Back: Symmetric, no curvature, ROM normal, no CVA tenderness Lungs:  respirations unlabored Heart: Regular rate and rhythm Abdomen: Soft, non-tender Extremities: Extremities normal, atraumatic, no cyanosis or edema Pulses: 2+ and symmetric all extremities Skin: Skin color, texture, turgor normal, no rashes or lesions  NEUROLOGIC:  Mental status: Alert and oriented x4, no aphasia, good attention span, fund of knowledge and memory  Motor Exam - grossly normal Sensory Exam - grossly normal Reflexes: 1+ Coordination - grossly normal Gait - grossly normal Balance - grossly normal Cranial Nerves: I: smell Not tested   II: visual acuity  OS: nl    OD: nl  II: visual fields Full to confrontation  II: pupils Equal, round, reactive to light  III,VII: ptosis None  III,IV,VI: extraocular muscles  Full ROM  V: mastication Normal  V: facial light touch sensation  Normal  V,VII: corneal reflex  Present  VII: facial muscle function - upper  Normal  VII: facial muscle function - lower Normal  VIII: hearing Not tested  IX: soft palate elevation  Normal  IX,X: gag reflex Present  XI: trapezius strength  5/5  XI: sternocleidomastoid strength 5/5  XI: neck flexion strength  5/5  XII: tongue strength  Normal    Data Review Lab Results  Component Value Date   WBC 11.7 (H) 02/26/2019   HGB 13.1 02/26/2019   HCT 41.4 02/26/2019   MCV 89.8 02/26/2019   PLT 234 02/26/2019   Lab Results  Component Value Date   NA 138 02/26/2019   K 3.9 02/26/2019   CL 105 02/26/2019   CO2 22 02/26/2019   BUN 18 02/26/2019   CREATININE 1.06 (H) 02/26/2019   GLUCOSE 176 (H) 02/26/2019   Lab Results  Component Value Date   INR 1.0 02/26/2019    Assessment:   Cervical neck pain with herniated nucleus pulposus/ spondylosis/ stenosis at C5-7. Estimated body mass index is 43.53 kg/m as calculated from the following:   Height as of this encounter: 5\' 2"  (1.575 m).   Weight as of this encounter: 108 kg.  Patient has failed conservative therapy. Planned surgery : ACDF C5-6 C-7  Plan:   I explained the condition and procedure to the patient and answered any questions.  Patient wishes to proceed with procedure as planned. Understands risks/ benefits/ and expected or typical outcomes.  Eustace Moore 03/08/2019 7:14 AM

## 2019-03-08 NOTE — Op Note (Signed)
03/08/2019  10:17 AM  PATIENT:  Laura Stephens  63 y.o. female  PRE-OPERATIVE DIAGNOSIS: Cervical spondylosis, degenerative disc disease, spinal stenosis C5-6 C6-7, neck and arm pain  POST-OPERATIVE DIAGNOSIS:  same  PROCEDURE:  1. Decompressive anterior cervical discectomy C5-6 C6-7, 2. Anterior cervical arthrodesis C5-6 C6-7 utilizing a cortical cancellus allograft bone graft, 3. Anterior cervical plating C5-7 inclusive utilizing a Alphatec plate  SURGEON:  Sherley Bounds, MD  ASSISTANTS: Glenford Peers FNP  ANESTHESIA:   General  EBL: 100 ml  Total I/O In: 1000 [I.V.:1000] Out: 100 [Blood:100]  BLOOD ADMINISTERED: none  DRAINS: none  SPECIMEN:  none  INDICATION FOR PROCEDURE: This patient presented with significant neck and arm pain. Imaging showed severe cervical spondylosis with cervical spinal stenosis. The patient tried conservative measures without relief. Pain was debilitating. Recommended ACDF with plating. Patient understood the risks, benefits, and alternatives and potential outcomes and wished to proceed.  PROCEDURE DETAILS: Patient was brought to the operating room placed under general endotracheal anesthesia. Patient was placed in the supine position on the operating room table. The neck was prepped with Duraprep and draped in a sterile fashion.   Three cc of local anesthesia was injected and a transverse incision was made on the right side of the neck.  Dissection was carried down thru the subcutaneous tissue and the platysma was  elevated, opened, and undermined with Metzenbaum scissors.  Dissection was then carried out thru an avascular plane leaving the sternocleidomastoid carotid artery and jugular vein laterally and the trachea and esophagus medially. The ventral aspect of the vertebral column was identified and a localizing x-ray was taken. The C5-6 level was identified. The longus colli muscles were then elevated and the retractor was placed to expose C5-6  and C6-7.  Large anterior osteophytes were removed with a Leksell rongeur.  the annulus was incised and the disc space entered. Discectomy was performed with micro-curettes and pituitary rongeurs. I then used the high-speed drill to drill the endplates down to the level of the posterior longitudinal ligament.  He had severe spondylosis and significant disc degeneration and collapse of the disc space. the operating microscope was draped and brought into the field provided additional magnification, illumination and visualization. Discectomy was continued posteriorly thru the disc space. Posterior longitudinal ligament was opened with a nerve hook, and then removed along with disc herniation and osteophytes, decompressing the spinal canal and thecal sac. We then continued to remove osteophytic overgrowth and disc material decompressing the neural foramina and exiting nerve roots bilaterally. The scope was angled up and down to help decompress and undercut the vertebral bodies. Once the decompression was completed we could pass a nerve hook circumferentially to assure adequate decompression in the midline and in the neural foramina. So by both visualization and palpation we felt we had an adequate decompression of the neural elements. We then measured the height of the intravertebral disc space and selected a 7 millimeter cortical cancellus allograft. It was then gently positioned in the intravertebral disc space(s) and countersunk. I then used a 30 mm atec plate and placed 12 mm variable angle screws into the vertebral bodies of each level and locked them into position. The wound was irrigated with bacitracin solution, checked for hemostasis which was established and confirmed. Once meticulous hemostasis was achieved, we then proceeded with closure. The platysma was closed with interrupted 3-0 undyed Vicryl suture, the subcuticular layer was closed with interrupted 3-0 undyed Vicryl suture. The skin edges were  approximated with steristrips.  The drapes were removed. A sterile dressing was applied. The patient was then awakened from general anesthesia and transferred to the recovery room in stable condition. At the end of the procedure all sponge, needle and instrument counts were correct.   PLAN OF CARE: Admit to inpatient   PATIENT DISPOSITION:  PACU - hemodynamically stable.   Delay start of Pharmacological VTE agent (>24hrs) due to surgical blood loss or risk of bleeding:  yes

## 2019-03-08 NOTE — Anesthesia Postprocedure Evaluation (Signed)
Anesthesia Post Note  Patient: Laura Stephens  Procedure(s) Performed: ACDF - C5-C6 - C6-C7 (N/A Spine Cervical)     Patient location during evaluation: PACU Anesthesia Type: General Level of consciousness: awake Pain management: pain level controlled Vital Signs Assessment: post-procedure vital signs reviewed and stable Respiratory status: spontaneous breathing Cardiovascular status: stable Postop Assessment: no apparent nausea or vomiting Anesthetic complications: no    Last Vitals:  Vitals:   03/08/19 1215 03/08/19 1242  BP: 131/81 (!) 158/77  Pulse: 66 72  Resp: 10 20  Temp: 36.5 C 36.7 C  SpO2: (!) 89% 94%    Last Pain:  Vitals:   03/08/19 1257  TempSrc:   PainSc: 4                  [name]

## 2019-03-08 NOTE — Anesthesia Procedure Notes (Addendum)
Procedure Name: Intubation Date/Time: 03/08/2019 7:42 AM Performed by: Verdie Drown, CRNA Pre-anesthesia Checklist: Patient identified, Emergency Drugs available, Suction available and Patient being monitored Patient Re-evaluated:Patient Re-evaluated prior to induction Oxygen Delivery Method: Circle System Utilized Preoxygenation: Pre-oxygenation with 100% oxygen Induction Type: IV induction and Cricoid Pressure applied Ventilation: Mask ventilation without difficulty Laryngoscope Size: Mac and 3 Grade View: Grade III Tube type: Oral Number of attempts: 2 (DL x1 with MAC3 for G3 view-unable to pass ETT through VC. DL with Glidescope MAC3 for G1 view, atraumatic intubation.) Airway Equipment and Method: Stylet,  Oral airway and Video-laryngoscopy Placement Confirmation: ETT inserted through vocal cords under direct vision,  positive ETCO2 and breath sounds checked- equal and bilateral Secured at: 22 cm Tube secured with: Tape Dental Injury: Teeth and Oropharynx as per pre-operative assessment  Difficulty Due To: Difficulty was anticipated and Difficult Airway- due to reduced neck mobility Comments: Smooth IV induction.  Pts head/neck remain in position of comfort per patient.

## 2019-03-09 ENCOUNTER — Encounter (HOSPITAL_COMMUNITY): Payer: Self-pay | Admitting: Neurological Surgery

## 2019-03-09 DIAGNOSIS — M47812 Spondylosis without myelopathy or radiculopathy, cervical region: Secondary | ICD-10-CM | POA: Diagnosis not present

## 2019-03-09 MED ORDER — HYDROXYZINE HCL 25 MG PO TABS
25.0000 mg | ORAL_TABLET | Freq: Three times a day (TID) | ORAL | Status: DC | PRN
  Administered 2019-03-09: 25 mg via ORAL
  Filled 2019-03-09: qty 1

## 2019-03-09 MED ORDER — HYDROCODONE-ACETAMINOPHEN 5-325 MG PO TABS: 1.0000 | ORAL_TABLET | ORAL | 0 refills | Status: AC | PRN

## 2019-03-09 MED ORDER — TIZANIDINE HCL 2 MG PO TABS: 2.0000 mg | ORAL_TABLET | Freq: Four times a day (QID) | ORAL | 0 refills | Status: AC | PRN

## 2019-03-09 MED ORDER — TRAZODONE HCL 100 MG PO TABS
100.0000 mg | ORAL_TABLET | Freq: Every day | ORAL | Status: DC
  Administered 2019-03-09: 100 mg via ORAL
  Filled 2019-03-09 (×3): qty 1

## 2019-03-09 NOTE — Care Management CC44 (Signed)
Condition Code 44 Documentation Completed  Patient Details  Name: Laura Stephens MRN: [id number] Date of Birth: 09/25/1955   Condition Code 44 given:  Yes Patient signature on Condition Code 44 notice:  Yes Documentation of 2 MD's agreement:  Yes Code 44 added to claim:  Yes    Pollie Friar, RN 03/09/2019, 10:15 AM

## 2019-03-09 NOTE — Care Management Obs Status (Signed)
[place] NOTIFICATION   Patient Details  Name: Laura Stephens MRN: [id number] Date of Birth: 05/02/1956   Medicare Observation Status Notification Given:  Yes    Pollie Friar, RN 03/09/2019, 10:15 AM

## 2019-03-09 NOTE — Progress Notes (Signed)
Orthopedic Tech Progress Note Patient Details:  Eduardo Karthika Glasper 08/08/[id number] RN said patient has collar Patient ID: Riona Gaylan Gerold, female   DOB: 05-03-1956, 63 y.o.   MRN: [id number]   Janit Pagan 03/09/2019, 7:33 AM

## 2019-03-09 NOTE — Evaluation (Addendum)
Occupational Therapy Evaluation and DISCHARGE   Patient Details Name: Laura Stephens MRN: [id number] DOB: 07/31/1956 Today's Date: 03/09/2019    History of Present Illness Pt is a 63 y/o female s/p ACDF C5-6, C6-7. PMH: anemia, anxiety, bipolar disorder, HTN, R shoulder arthroscopy x 3.    Clinical Impression   PTA patient independent.  Admitted for above and limited by pain and cervical precautions. Patient currently requires supervision for UB/LB ADLs, transfers and mobility; min cueing to adhere to precautions throughout session.  Educated on precautions, ADL compensatory techniques, brace mgmt and wear schedule, safety and recommendations.  She reports her granddaughter will be staying with her initially to provide support as needed. Based on performance today, no further OT needs identified and OT will sign off. Thank you for this referral.     Follow Up Recommendations  No OT follow up;Supervision - Intermittent    Equipment Recommendations  None recommended by OT    Recommendations for Other Services       Precautions / Restrictions Precautions Precautions: Cervical Precaution Booklet Issued: Yes (comment) Precaution Comments: reviewed with pt Required Braces or Orthoses: Cervical Brace Cervical Brace: Hard collar(applied in sitting) Restrictions Weight Bearing Restrictions: No      Mobility Bed Mobility               General bed mobility comments: seated EOB upon entry   Transfers Overall transfer level: Needs assistance Equipment used: None Transfers: Sit to/from Stand Sit to Stand: Supervision         General transfer comment: supervision for safety     Balance Overall balance assessment: Mild deficits observed, not formally tested                                         ADL either performed or assessed with clinical judgement   ADL Overall ADL's : Needs assistance/impaired     Grooming: Supervision/safety;Cueing for  compensatory techniques;Sitting   Upper Body Bathing: Supervision/ safety;Sitting;Cueing for compensatory techniques   Lower Body Bathing: Supervison/ safety;Sit to/from stand;Cueing for compensatory techniques;Cueing for back precautions   Upper Body Dressing : Supervision/safety;Sitting;Cueing for compensatory techniques   Lower Body Dressing: Supervision/safety;Sit to/from stand;Cueing for compensatory techniques;Cueing for back precautions   Toilet Transfer: Supervision/safety;Ambulation;Regular Toilet       Tub/ Shower Transfer: Walk-in shower;Supervision/safety;Ambulation;Shower Field seismologistseat Tub/Shower Transfer Details (indicate cue type and reason): simulated in room, supervision with education on UE support  Functional mobility during ADLs: Supervision/safety General ADL Comments: Patinet educated on ADl compensatory techniques, cervical precautions, brace mgmt and wear schedule and recommendations; agreeable to seated ADls, able to complete figure 4 technique and don clothing with supervision; requires min cueing for cervical precuation adherance      Vision         Perception     Praxis      Pertinent Vitals/Pain Pain Assessment: 0-10 Pain Score: 4  Pain Location: neck- incisional Pain Descriptors / Indicators: Discomfort;Operative site guarding Pain Intervention(s): Limited activity within patient's tolerance;Premedicated before session;Monitored during session     Hand Dominance Right   Extremity/Trunk Assessment Upper Extremity Assessment Upper Extremity Assessment: Generalized weakness(within precautions )   Lower Extremity Assessment Lower Extremity Assessment: Overall WFL for tasks assessed   Cervical / Trunk Assessment Cervical / Trunk Assessment: Other exceptions Cervical / Trunk Exceptions: s/p ACDF   Communication Communication Communication: No difficulties   Cognition Arousal/Alertness: Awake/alert  Behavior During Therapy: WFL for tasks  assessed/performed Overall Cognitive Status: Within Functional Limits for tasks assessed                                     General Comments       Exercises     Shoulder Instructions      Home Living Family/patient expects to be discharged to:: Private residence Living Arrangements: Alone Available Help at Discharge: Family;Available 24 hours/day(granddaughter staying for a few days ) Type of Home: House       Home Layout: One level     Bathroom Shower/Tub: Occupational psychologist: Handicapped height     Home Equipment: [place] in          Prior Functioning/Environment Level of Independence: Independent        Comments: independent and driving         OT Problem List: Decreased strength;Decreased knowledge of use of DME or AE;Decreased knowledge of precautions;Pain      OT Treatment/Interventions:      OT Goals(Current goals can be found in the care plan section) Acute Rehab OT Goals Patient Stated Goal: home today OT Goal Formulation: With patient Time For Goal Achievement: 03/23/19 Potential to Achieve Goals: Good  OT Frequency:     Barriers to D/C:            Co-evaluation              AM-PAC OT "6 Clicks" Daily Activity     Outcome Measure Help from another person eating meals?: None Help from another person taking care of personal grooming?: None Help from another person toileting, which includes using toliet, bedpan, or urinal?: None Help from another person bathing (including washing, rinsing, drying)?: None Help from another person to put on and taking off regular upper body clothing?: None Help from another person to put on and taking off regular lower body clothing?: None 6 Click Score: 24   End of Session Equipment Utilized During Treatment: Cervical collar Nurse Communication: Mobility status  Activity Tolerance: Patient tolerated treatment well Patient left: with call bell/phone within  reach;Other (comment)(seated EOB)  OT Visit Diagnosis: Muscle weakness (generalized) (M62.81);Pain Pain - part of body: (neck- incisional)                Time: 2778-2423 OT Time Calculation (min): 21 min Charges:  OT General Charges $OT Visit: 1 Visit OT Evaluation $OT Eval Moderate Complexity: [place], OT Acute Rehabilitation Services Pager [phone] Office [phone]   Delight Stare 03/09/2019, 9:56 AM

## 2019-03-09 NOTE — Plan of Care (Signed)
Patient alert and oriented, mae's well, voiding adequate amount of urine, swallowing without difficulty, no c/o pain at time of discharge. Patient discharged home with family. Script and discharged instructions given to patient. Patient and family stated understanding of instructions given. Patient has an appointment with Dr. Jones °

## 2019-03-09 NOTE — Discharge Summary (Signed)
Physician Discharge Summary  Patient ID: Laura Stephens MRN: [id number] DOB/AGE: 65/02/1956 65 y.o.  Admit date: 03/08/2019 Discharge date: 03/09/2019  Admission Diagnoses: Cervical spondylosis, degenerative disc disease, spinal stenosis C5-6 C6-7, neck and arm pain  Discharge Diagnoses: same   Discharged Condition: good  Hospital Course: The patient was admitted on 03/08/2019 and taken to the operating room where the patient underwent acdf C5-6, C6-7. The patient tolerated the procedure well and was taken to the recovery room and then to the floor in stable condition. The hospital course was routine. There were no complications. The wound remained clean dry and intact. Pt had appropriate neck soreness. No complaints of arm pain or new N/T/W. The patient remained afebrile with stable vital signs, and tolerated a regular diet. The patient continued to increase activities, and pain was well controlled with oral pain medications.   Consults: None  Significant Diagnostic Studies:  Results for orders placed or performed during the hospital encounter of 03/04/19  SARS Coronavirus 2 (Performed [facility])   Specimen: Nasal Swab  Result Value Ref Range   SARS Coronavirus 2 NEGATIVE NEGATIVE    Chest 2 View  Result Date: 02/26/2019 CLINICAL DATA:  Pre operative respiratory exam. Cervical disc disease. Cervicalgia. EXAM: CHEST - 2 VIEW COMPARISON:  None. FINDINGS: The heart size and mediastinal contours are within normal limits. Both lungs are clear. Slight accentuation of the thoracic kyphosis. IMPRESSION: No significant abnormalities. Electronically Signed   By: Francene BoyersJames  Maxwell M.D.   On: 02/26/2019 15:22   Dg Cervical Spine 2-3 Views  Result Date: 03/08/2019 CLINICAL DATA:  C5-C7 ACDF. EXAM: CERVICAL SPINE - 2-3 VIEW; DG C-ARM 61-120 MIN COMPARISON:  None. FLUOROSCOPY TIME:  38 seconds. C-arm fluoroscopic images were obtained intraoperatively and submitted for post operative  interpretation. FINDINGS: Frontal and lateral intraoperative fluoroscopic images demonstrate C5-C7 ACDF. No hardware complication. No acute osseous abnormality. IMPRESSION: Intraoperative fluoroscopic guidance for C5-C7 ACDF. Electronically Signed   By: Obie DredgeWilliam T Derry M.D.   On: 03/08/2019 11:18   Dg C-arm 1-60 Min  Result Date: 03/08/2019 CLINICAL DATA:  C5-C7 ACDF. EXAM: CERVICAL SPINE - 2-3 VIEW; DG C-ARM 61-120 MIN COMPARISON:  None. FLUOROSCOPY TIME:  38 seconds. C-arm fluoroscopic images were obtained intraoperatively and submitted for post operative interpretation. FINDINGS: Frontal and lateral intraoperative fluoroscopic images demonstrate C5-C7 ACDF. No hardware complication. No acute osseous abnormality. IMPRESSION: Intraoperative fluoroscopic guidance for C5-C7 ACDF. Electronically Signed   By: Obie DredgeWilliam T Derry M.D.   On: 03/08/2019 11:18    Antibiotics:  Anti-infectives (From admission, onward)   Start     Dose/Rate Route Frequency Ordered Stop   03/08/19 1600  ceFAZolin (ANCEF) IVPB 2g/100 mL premix     2 g 200 mL/hr over 30 Minutes Intravenous Every 8 hours 03/08/19 1234 03/09/19 0007   03/08/19 0829  bacitracin 50,000 Units in sodium chloride 0.9 % 500 mL irrigation  Status:  Discontinued       As needed 03/08/19 0829 03/08/19 1018   03/08/19 0600  ceFAZolin (ANCEF) IVPB 2g/100 mL premix     2 g 200 mL/hr over 30 Minutes Intravenous On call to O.R. 03/08/19 0550 03/08/19 0745      Discharge Exam: Blood pressure (!) 128/56, pulse 66, temperature 98.1 F (36.7 C), temperature source Oral, resp. rate 18, height 5\' 2"  (1.575 m), weight 108 kg, SpO2 91 %. Neurologic: Grossly normal Ambulating and voiding well, incision cdi  Discharge Medications:   Allergies as of 03/09/2019  Reactions   Morphine And Related Itching   Sulfur Itching   Vibramycin [doxycycline Calcium] Itching      Medication List    TAKE these medications   acetaminophen 500 MG tablet Commonly known  as: TYLENOL Take 1,000 mg by mouth every 6 (six) hours as needed for moderate pain.   citalopram 40 MG tablet Commonly known as: CELEXA Take 40 mg by mouth daily.   esomeprazole 20 MG capsule Commonly known as: NEXIUM Take 20 mg by mouth daily at 12 noon.   fluticasone 50 MCG/ACT nasal spray Commonly known as: FLONASE Place 1 spray into both nostrils daily.   HYDROcodone-acetaminophen 5-325 MG tablet Commonly known as: NORCO/VICODIN Take 1 tablet by mouth every 4 (four) hours as needed for moderate pain ((score 4 to 6)).   hydrOXYzine 25 MG tablet Commonly known as: ATARAX/VISTARIL Take 25 mg by mouth 3 (three) times daily as needed for anxiety.   lamoTRIgine 25 MG tablet Commonly known as: LAMICTAL Take 50 mg by mouth daily.   losartan 25 MG tablet Commonly known as: COZAAR Take 25 mg by mouth daily.   tiZANidine 2 MG tablet Commonly known as: ZANAFLEX Take 1 tablet (2 mg total) by mouth every 6 (six) hours as needed.   traZODone 100 MG tablet Commonly known as: DESYREL Take 200 mg by mouth at bedtime.       Disposition: home   Final Dx: acdf C5-6, 6-7  Discharge Instructions     Remove dressing in 72 hours   Complete by: As directed    Call MD for:  difficulty breathing, headache or visual disturbances   Complete by: As directed    Call MD for:  hives   Complete by: As directed    Call MD for:  persistant dizziness or light-headedness   Complete by: As directed    Call MD for:  persistant nausea and vomiting   Complete by: As directed    Call MD for:  redness, tenderness, or signs of infection (pain, swelling, redness, odor or green/yellow discharge around incision site)   Complete by: As directed    Call MD for:  severe uncontrolled pain   Complete by: As directed    Call MD for:  temperature >100.4   Complete by: As directed    Diet - low sodium heart healthy   Complete by: As directed    Driving Restrictions   Complete by: As directed    No  driving for 2 weeks, no riding in the car for 1 week   Increase activity slowly   Complete by: As directed    Lifting restrictions   Complete by: As directed    No lifting more than 8 lbs         Signed: Ocie Cornfield Meyran 03/09/2019, 8:50 AM

## 2020-09-04 IMAGING — CR CHEST - 2 VIEW
2 series · 2 of 2 positions shown · non-contrast
Comparison: None.

CLINICAL DATA: Pre operative respiratory exam. Cervical disc
disease. Cervicalgia.

EXAM:
CHEST - 2 VIEW

[w chest pa]
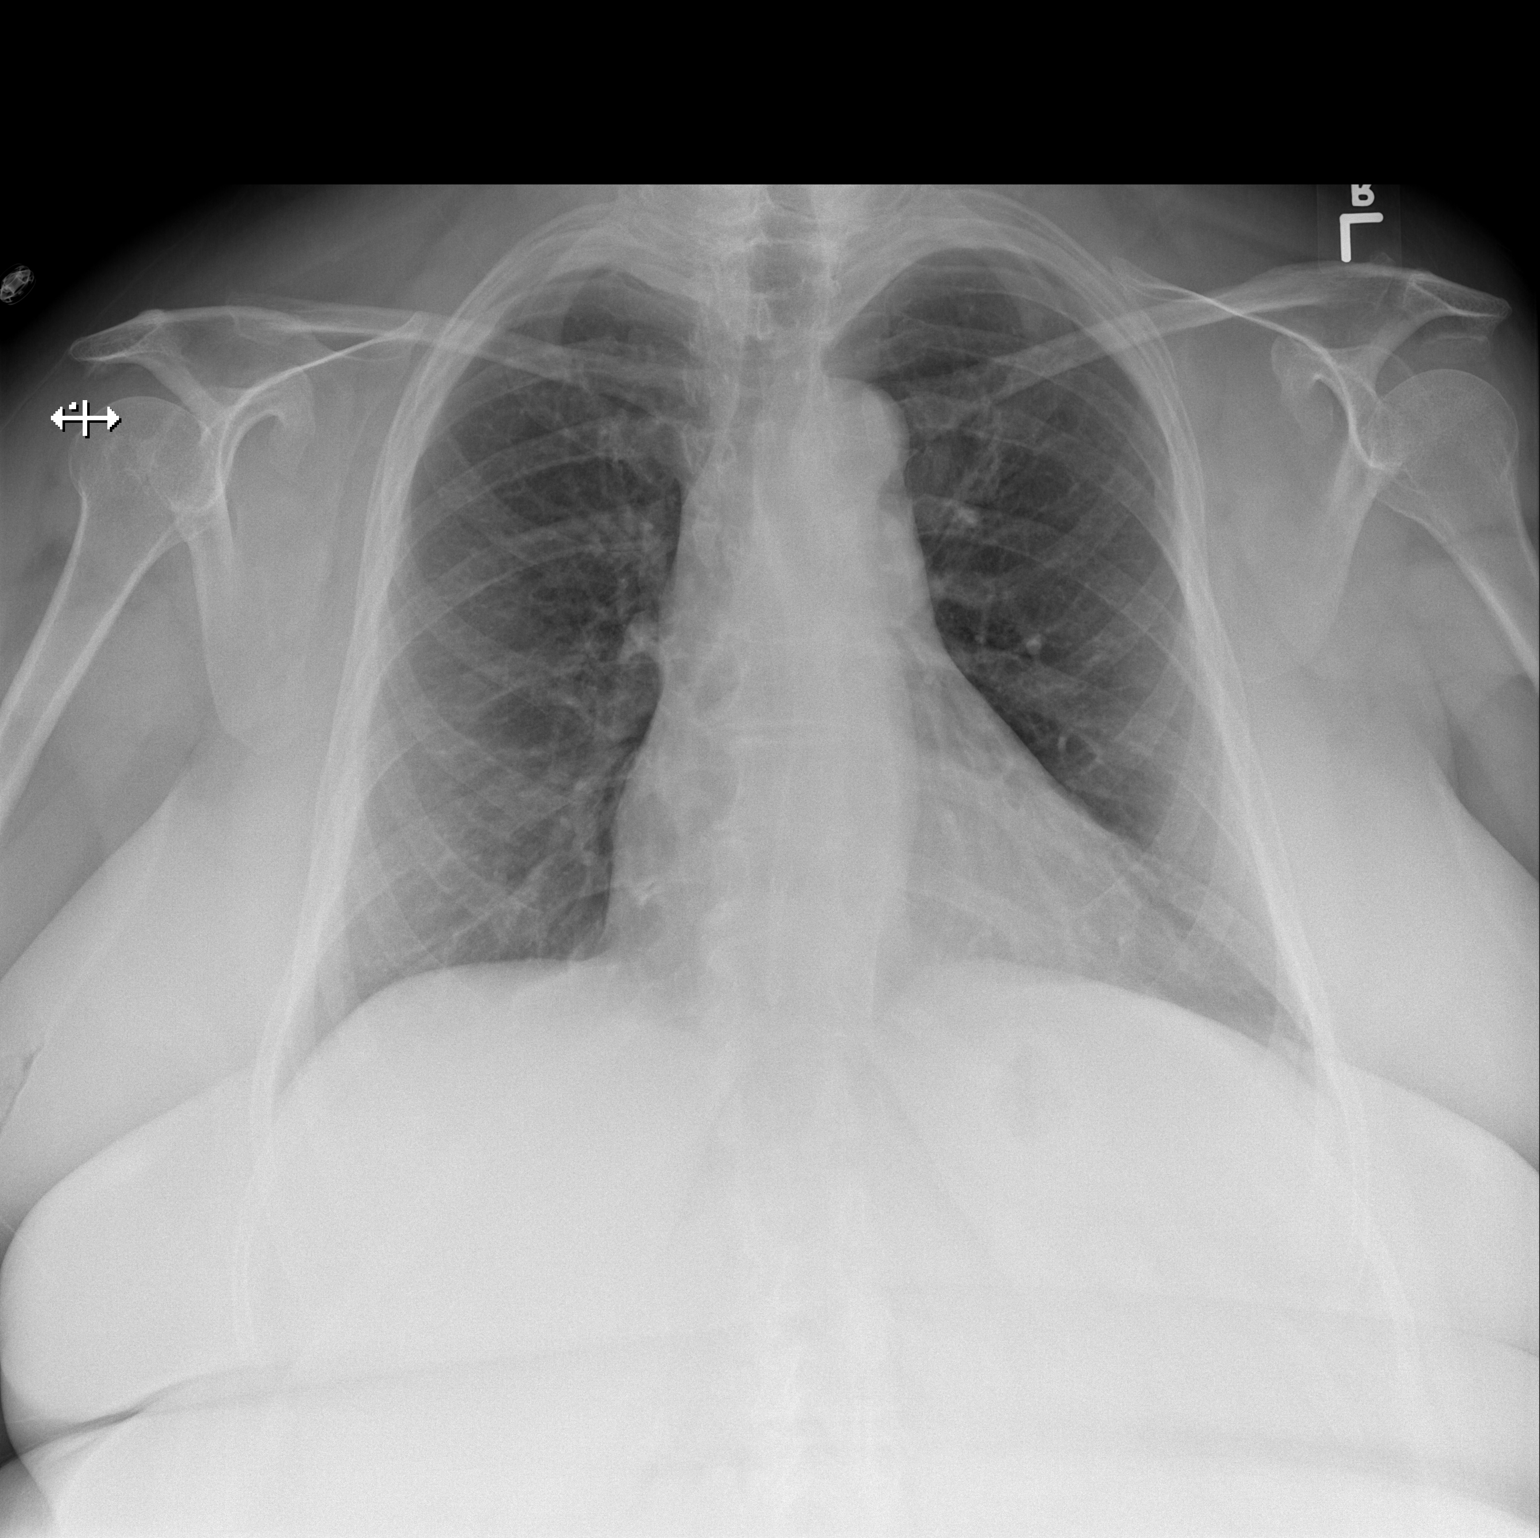

[w chest lat]
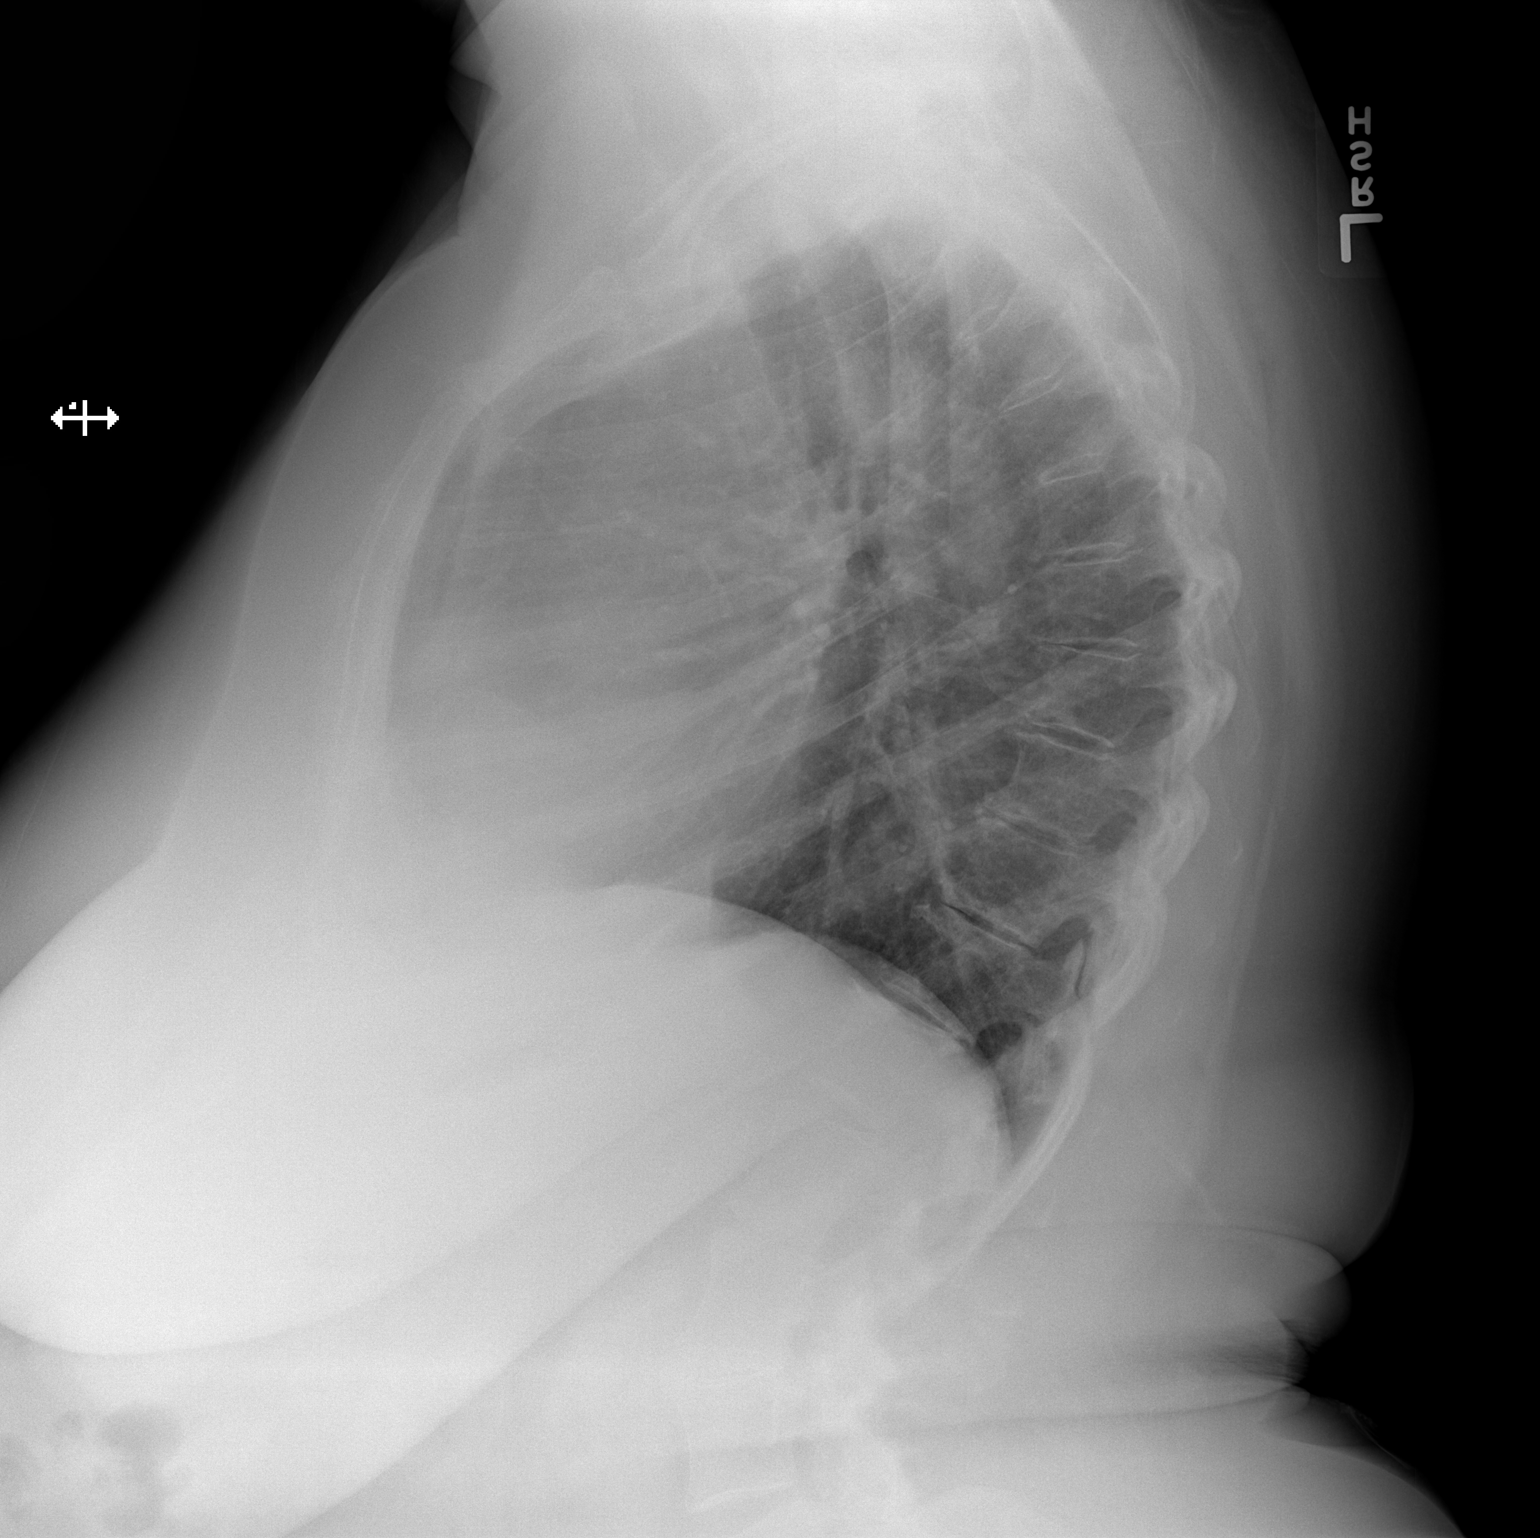

[2 of 2 positions shown; findings below may reference images not displayed]

FINDINGS: The heart size and mediastinal contours are within normal limits.
Both lungs are clear. Slight accentuation of the thoracic kyphosis.
IMPRESSION: No significant abnormalities.

## 2021-09-04 ENCOUNTER — Other Ambulatory Visit: Payer: Self-pay | Admitting: Neurological Surgery

## 2021-09-04 DIAGNOSIS — M5416 Radiculopathy, lumbar region: Secondary | ICD-10-CM

## 2021-09-06 ENCOUNTER — Ambulatory Visit
Admission: RE | Admit: 2021-09-06 | Discharge: 2021-09-06 | Disposition: A | Discharge status: Home / Self Care | Payer: Medicare Other | Source: Ambulatory Visit | Attending: Neurological Surgery | Admitting: Neurological Surgery

## 2021-09-06 DIAGNOSIS — M5416 Radiculopathy, lumbar region: Secondary | ICD-10-CM

## 2021-09-06 MED ORDER — MEPERIDINE HCL 50 MG/ML IJ SOLN: 50.0000 mg | Freq: Once | INTRAMUSCULAR | Status: DC | PRN

## 2021-09-06 MED ORDER — ONDANSETRON HCL 4 MG/2ML IJ SOLN: 4.0000 mg | Freq: Once | INTRAMUSCULAR | Status: DC | PRN

## 2021-09-06 MED ORDER — IOPAMIDOL (ISOVUE-M 200) INJECTION 41%
20.0000 mL | Freq: Once | INTRAMUSCULAR | Status: AC
  Administered 2021-09-06: 14:00:00 20 mL via INTRATHECAL

## 2021-09-06 MED ORDER — DIAZEPAM 5 MG PO TABS
5.0000 mg | ORAL_TABLET | Freq: Once | ORAL | Status: AC
  Administered 2021-09-06: 13:00:00 5 mg via ORAL

## 2021-09-06 NOTE — Discharge Instructions (Signed)

## 2023-03-16 IMAGING — XA DG MYELOGRAPHY LUMBAR INJ LUMBOSACRAL
13 of 17 series · 13 of 17 positions shown · IV contrast (omnipaque)
Comparison: MR 12/21/2020

CLINICAL DATA: Lumbar radiculopathy.  No previous surgery.

EXAM:
LUMBAR MYELOGRAM
CT LUMBAR SPINE WITH INTRATHECAL CONTRAST
FLUOROSCOPY TIME:  84 seconds; 446 uEymZ DAP
TECHNIQUE: The procedure, risks (including but not limited to bleeding,
infection, organ damage ), benefits, and alternatives were explained
to the patient. Questions regarding the procedure were encouraged
and answered. The patient understands and consents to the procedure.
An appropriate entry site was determined under fluoroscopy. Operator
donned sterile gloves and mask. Skin site was marked, prepped with
Betadine, and draped in usual sterile fashion, and infiltrated
locally with 1% lidocaine. A 5-inch 22 gauge spinal needle was
advanced into the thecal sac at L3-4 from a left parasagittal
approach. Clear colorless CSF returned. 17 ml Omnipaque 180 were
administered intrathecally for lumbar myelography, followed by axial
CT scanning of the lumbar spine.
Operator: Zuraed Merthawijaya NP
I personally supervised the lumbar puncture and administration of
the intrathecal contrast. I also personally supervised acquisition
of the myelogram images. Coronal and sagittal reconstructions were
generated from the axial scan.

[Series 1: w lumbar spine lat · 0.15mm/px · 1 of 1 slices shown]
[im 1/1]
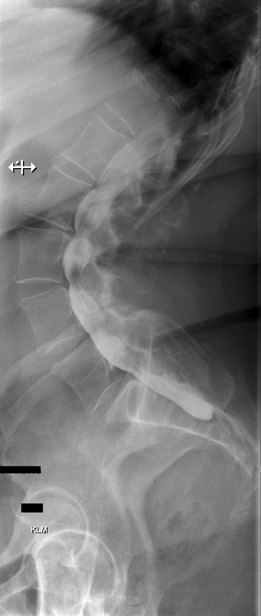

[Series 1: vasc adipose · 1 of 1 slices shown (1 of 12)]
[im 1/1]
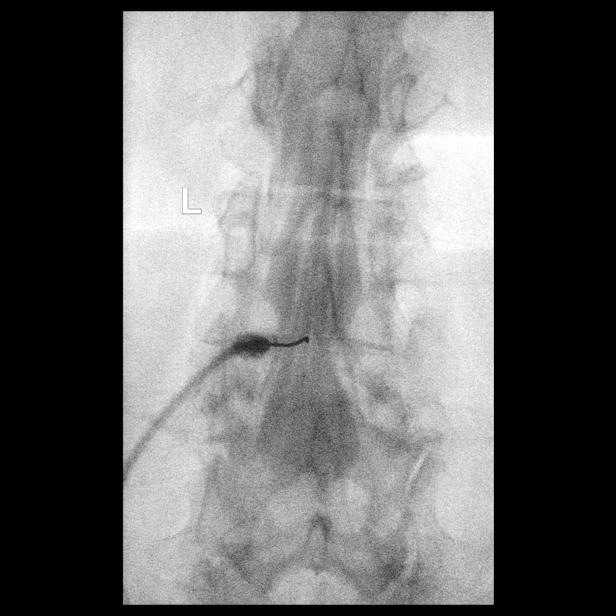

[Series 2: vasc adipose · 1 of 1 slices shown (2 of 12)]
[im 1/1]
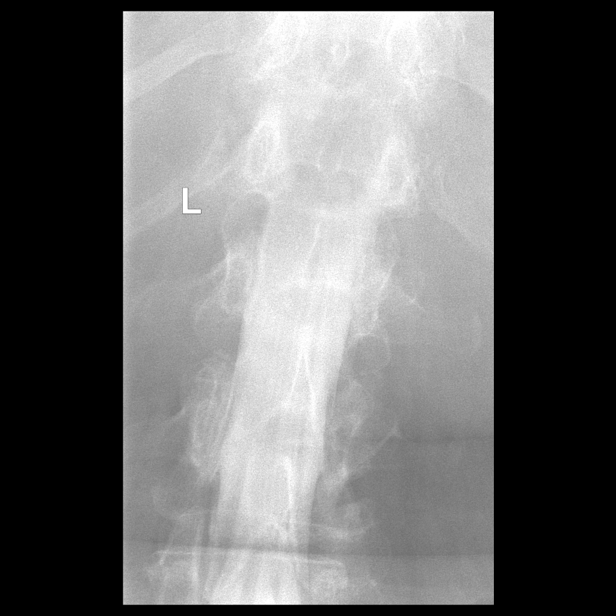

[Series 3: vasc adipose · 1 of 1 slices shown (3 of 12)]
[im 1/1]
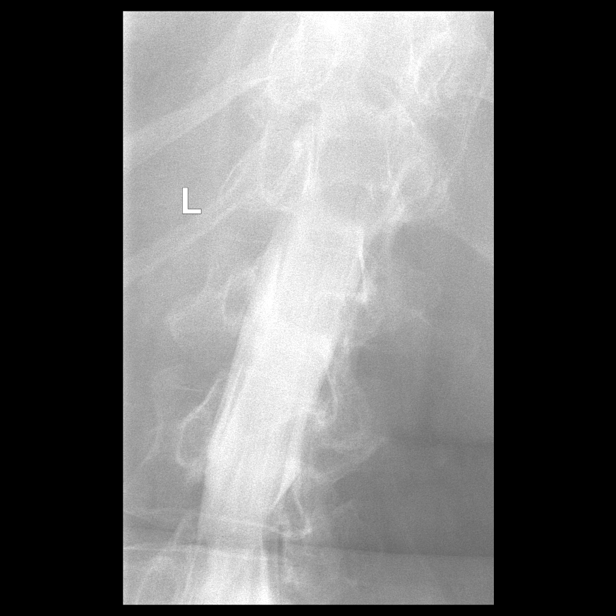

[Series 4: vasc adipose · 1 of 1 slices shown (4 of 12)]
[im 1/1]
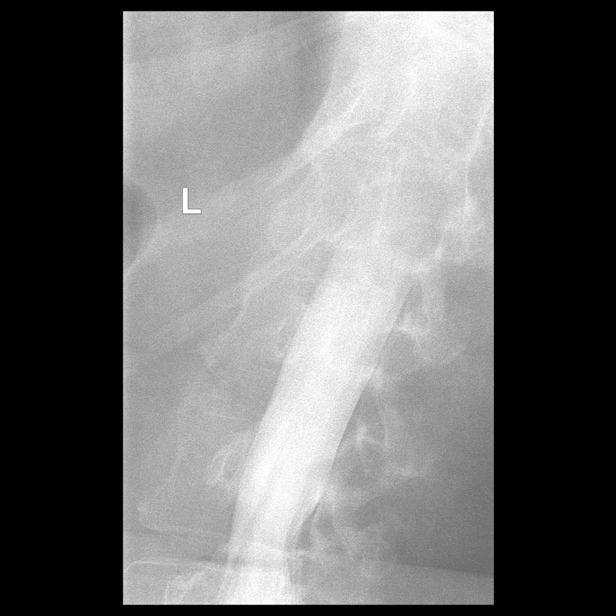

[Series 5: vasc adipose · 1 of 1 slices shown (5 of 12)]
[im 1/1]
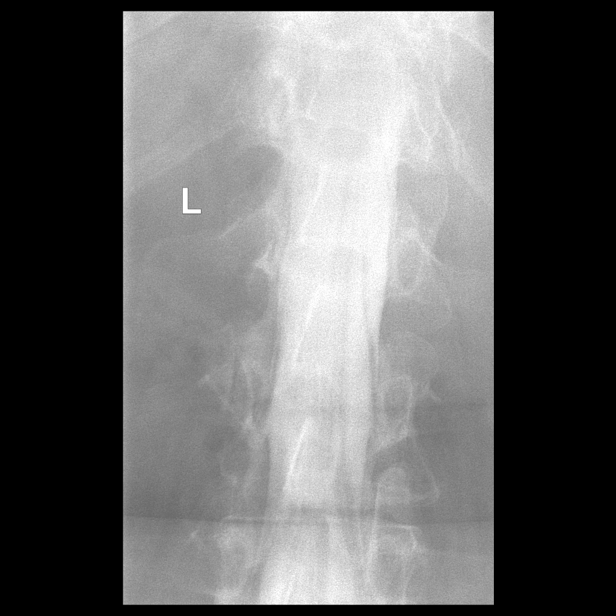

[Series 6: vasc adipose · 1 of 1 slices shown (6 of 12)]
[im 1/1]
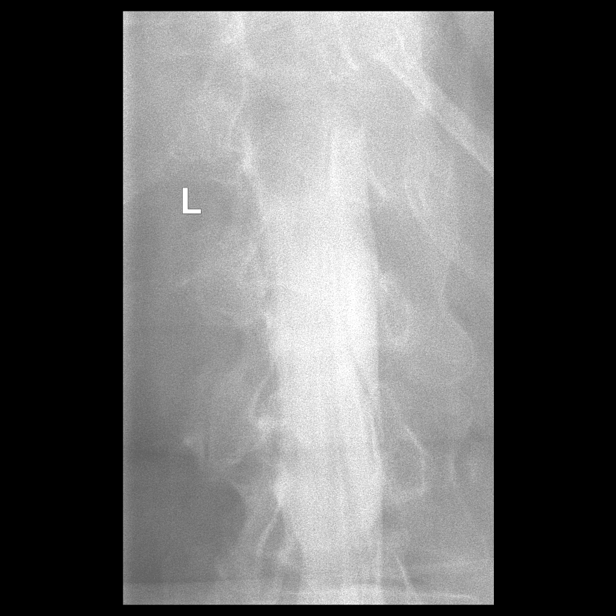

[Series 7: vasc adipose · 1 of 1 slices shown (7 of 12)]
[im 1/1]
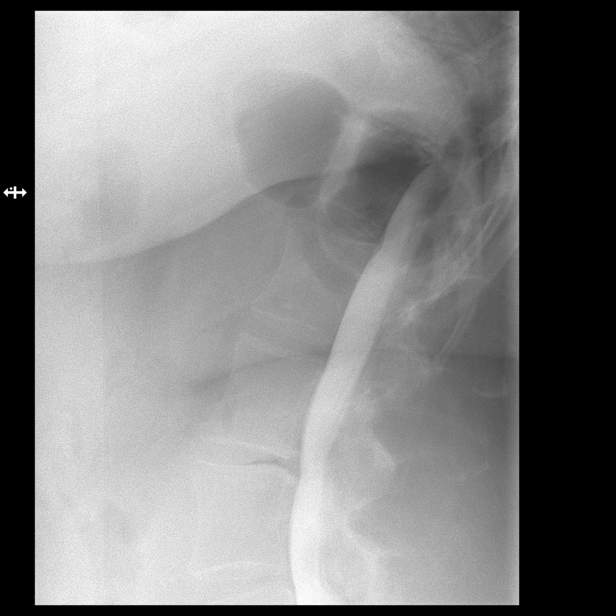

[Series 9: vasc adipose · 1 of 1 slices shown (8 of 12)]
[im 1/1]
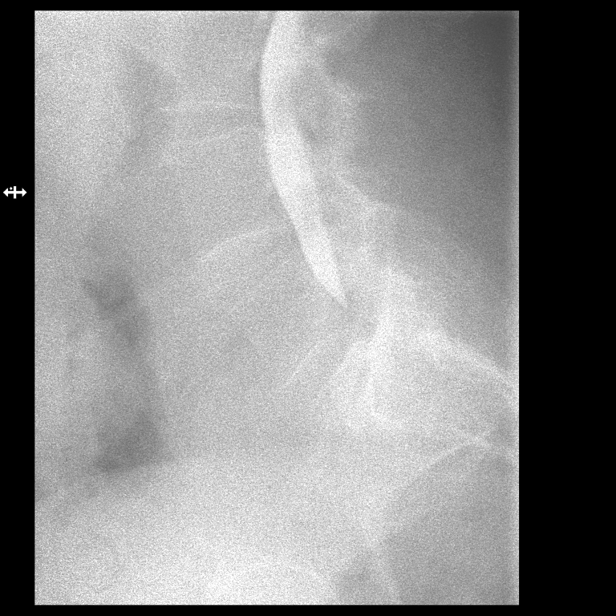

[Series 10: vasc adipose · 1 of 1 slices shown (9 of 12)]
[im 1/1]
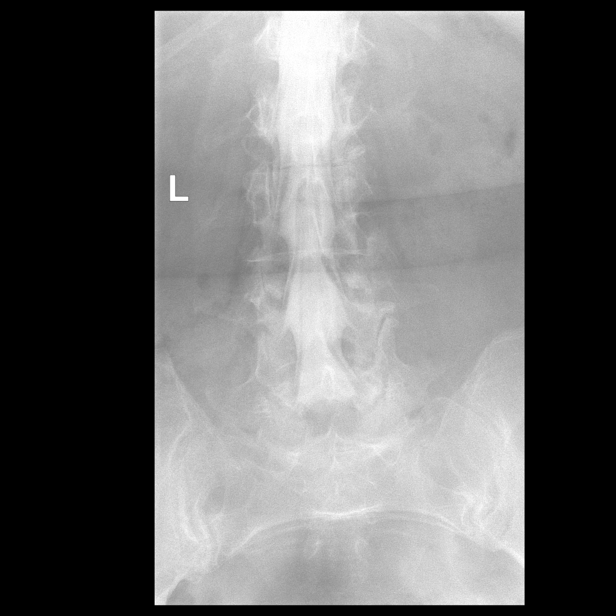

[Series 11: vasc adipose · 1 of 1 slices shown (10 of 12)]
[im 1/1]
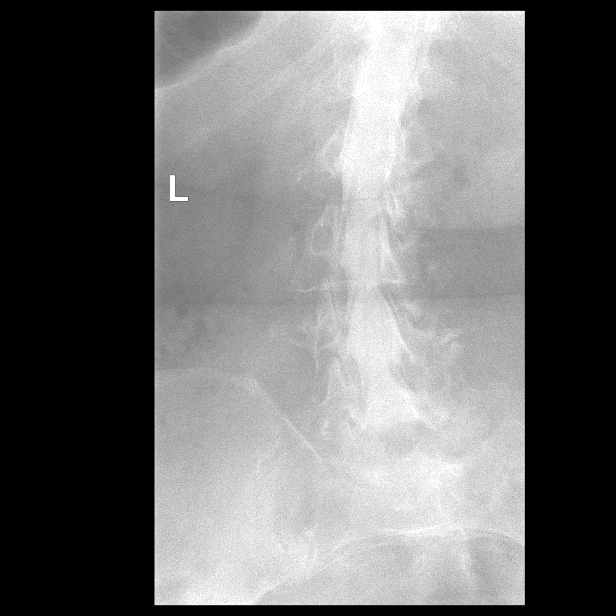

[Series 13: vasc adipose · 1 of 1 slices shown (11 of 12)]
[im 1/1]
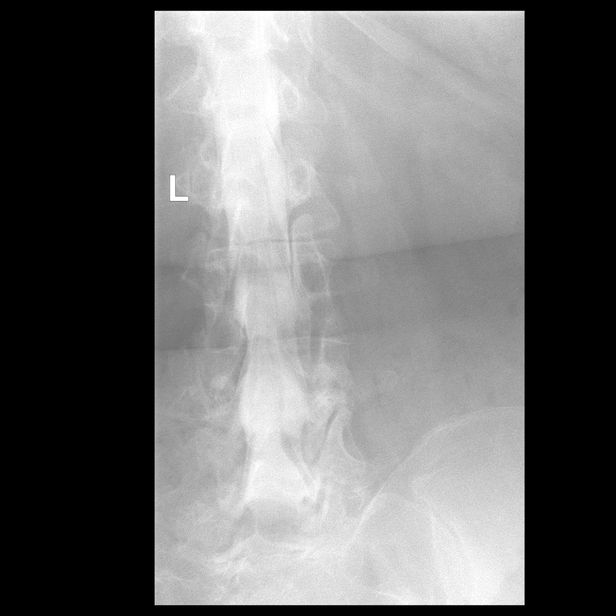

[Series 14: vasc adipose · 1 of 1 slices shown (12 of 12)]
[im 1/1]
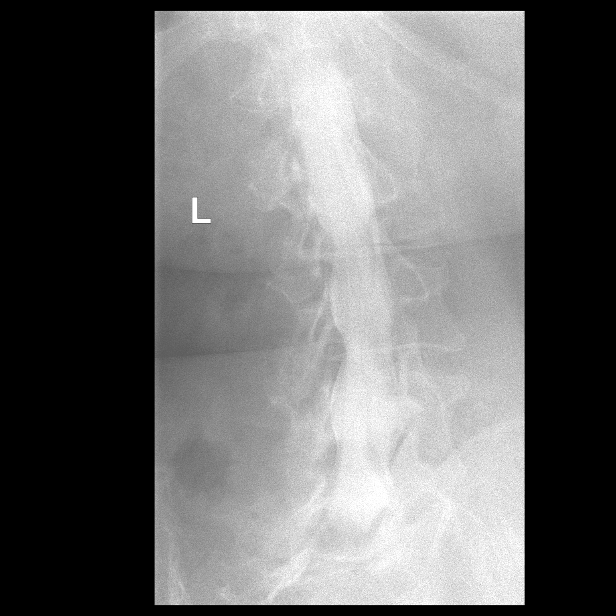

[13 of 17 positions shown; findings below may reference images not displayed]

FINDINGS: 5 non rib-bearing lumbar segments assigned L1-L5.  Normal alignment.

T11-12: Narrowing of the interspace with vacuum phenomenon. Broad
posterior bulge with associated endplate spurring. Mild narrowing of
the central canal with no cord compression or distortion. Foramina
patent.

T12-L1: Conus terminates behind L1. Tiny right paracentral calcified
bulge. Central canal widely patent. Foramina patent.

L1-2:  Interspace unremarkable.  No spinal or foraminal stenosis.

L2-3: Narrowing of the interspace with central vacuum phenomenon.
Broad posterior bulge with endplate spurring. Early facet DJD. There
is mild narrowing of the central canal in a trefoil configuration.
Early foraminal encroachment right greater than left.

L3-4: Mild vacuum phenomenon in the interspace. Bilateral advanced
facet DJD with bilateral foraminal encroachment. Small anterior
synovial cyst from the left facet slightly displaces the left L4
nerve root. Central canal patent.

L4-5: Interspace unremarkable. Moderate bilateral facet DJD, right
worse than left. Synovial cyst protruding medially from the anterior
aspect of the right L4-5 facet displaces the right L5 nerve root
anteriorly. No spinal stenosis. There is mild bilateral foraminal
encroachment.

L5-S1: Mild posterior disc bulge. Moderate bilateral facet DJD and
some thickening of ligamentum flavum resulting in mild central canal
stenosis and bilateral foraminal encroachment.

Minimal calcified aortic plaque without aneurysm. Horseshoe kidney.
Remainder of visualized paraspinal soft tissues unremarkable.
IMPRESSION: 1. Broad posterior bulge T11-12 without compressive pathology.
2. Small right paracentral bulge T12-L1 without compressive
pathology.
3. Mild multifactorial spinal stenosis L2-3 with foraminal
encroachment, right worse than left.
4. Advanced facet DJD L3-4 with bilateral foraminal encroachment.
Left anterior synovial cyst displaces the left L4 nerve root.
5. Facet DJD L4-5 with mild foraminal encroachment. Right anterior
synovial cyst displaces the right L5 nerve root.
6. Mild multifactorial spinal stenosis and foraminal encroachment
L5-S1.
7. Horseshoe kidney
8.  Aortic Atherosclerosis ([id number]-[id number]).
